# Patient Record
Sex: Female | Born: 1990 | Race: Black or African American | Hispanic: No | Marital: Single | Smoking: Never smoker
Health system: Southern US, Community
[De-identification: ages and names within clinical notes are randomized; demographics above are authoritative.]

## PROBLEM LIST (undated history)

## (undated) DIAGNOSIS — E669 Obesity, unspecified: Secondary | ICD-10-CM

## (undated) DIAGNOSIS — D271 Benign neoplasm of left ovary: Secondary | ICD-10-CM

## (undated) DIAGNOSIS — B001 Herpesviral vesicular dermatitis: Secondary | ICD-10-CM

## (undated) DIAGNOSIS — Z6841 Body Mass Index (BMI) 40.0 and over, adult: Secondary | ICD-10-CM

## (undated) HISTORY — PX: NO PAST SURGERIES: SHX2092

---
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
## 2015-06-01 DIAGNOSIS — B009 Herpesviral infection, unspecified: Secondary | ICD-10-CM | POA: Insufficient documentation

## 2015-11-20 ENCOUNTER — Inpatient Hospital Stay
Admission: EM | Admit: 2015-11-20 | Discharge: 2015-11-20 | Disposition: A | Discharge status: Home / Self Care | Payer: Medicaid Other | Source: Home / Self Care | Admitting: Obstetrics and Gynecology

## 2015-11-20 DIAGNOSIS — Z3A41 41 weeks gestation of pregnancy: Secondary | ICD-10-CM | POA: Insufficient documentation

## 2015-11-20 NOTE — Discharge Summary (Signed)
Physician Discharge Summary  Patient ID: KARAGAN SIRKIN MRN: [id number] DOB/AGE: Mar 22, 1991 25 y.o.  Admit date: 11/20/2015 Discharge date: 11/20/2015  Admission Diagnoses: Pt is G1P0 at [redacted]w[redacted]d with c/o loss of mucous plug and contractions every 30 minutes. She admits fetal movement. She denies LOF, VB. Pt has [facility] in [place]. She is here visiting family and scheduled for IOL on Wednesday of this week in [place].   Discharge Diagnoses:  Active Problems:   * No active hospital problems. * No cervical change, Reactive NST  Discharged Condition: good  Hospital Course: Pt is admitted for observation and placed on monitors. Cervical exam on admission and prior to discharge.  Consults: None  Significant Diagnostic Studies: none  Treatments: none  Discharge Exam: Temperature 98.2 F (36.8 C), temperature source Oral, height 5\' 5"  (1.651 m), weight 114.3 kg (252 lb). General appearance: alert, cooperative, appears stated age and no distress Resp: clear to auscultation bilaterally Cardio: regular rate and rhythm Pelvic: Cervix: 1/80/-3, no change from admission to discharge, pink mucous Toco: every 20-30 minutes Fetal Well Being: 135 bpm, moderate variability, +accelerations, -decelerations Category I tracing  Disposition: Discharge to home  Discharge Instructions    Discharge activity:  No Restrictions    Complete by:  As directed   Discharge diet:  No restrictions    Complete by:  As directed   Fetal Kick Count:  Lie on our left side for one hour after a meal, and count the number of times your baby kicks.  If it is less than 5 times, get up, move around and drink some juice.  Repeat the test 30 minutes later.  If it is still less than 5 kicks in an hour, notify your doctor.    Complete by:  As directed   LABOR:  When conractions begin, you should start to time them from the beginning of one contraction to the beginning  of the next.  When contractions are 5 - 10 minutes apart or less  and have been regular for at least an hour, you should call your health care provider.    Complete by:  As directed   No sexual activity restrictions    Complete by:  As directed   Notify physician for bleeding from the vagina    Complete by:  As directed   Notify physician for blurring of vision or spots before the eyes    Complete by:  As directed   Notify physician for chills or fever    Complete by:  As directed   Notify physician for fainting spells, "black outs" or loss of consciousness    Complete by:  As directed   Notify physician for increase in vaginal discharge    Complete by:  As directed   Notify physician for leaking of fluid    Complete by:  As directed   Notify physician for pain or burning when urinating    Complete by:  As directed   Notify physician for pelvic pressure (sudden increase)    Complete by:  As directed   Notify physician for severe or continued nausea or vomiting    Complete by:  As directed   Notify physician for sudden gushing of fluid from the vagina (with or without continued leaking)    Complete by:  As directed   Notify physician for sudden, constant, or occasional abdominal pain    Complete by:  As directed   Notify physician if baby moving less than usual    Complete  by:  As directed       Medication List    You have not been prescribed any medications.      SignedRod Can, CNM

## 2015-11-20 NOTE — Discharge Instructions (Signed)
Keep next scheduled appt with your provided Drucie Opitz and [facility] in [place] for November 22, 2015 for induction of labor unless seen prior to that for active labor.  Follow instructions given for labor precautions and signs of labor.  Lenore Cordia RNC

## 2015-11-20 NOTE — Plan of Care (Signed)
Pt ready for discharge home per CNM [place]. Pt agrees with plan of care. She has appt in [facility] August 2 for induction of labor with her provider Drucie Opitz and [employer].  Pt states she will be returning to [facility] asap.  Pt leaving dept in stable condition with family members ambulatory. Given verbal and written discharge instructions for labor precautions. Lenore Cordia RNC

## 2015-11-20 NOTE — OB Triage Note (Signed)
Pt arrived to [facility] with complaint of being overdue for pregnancy and loosing her mucous plug. Pt is a G1 P0 EDC November 11 2015 41.1 weeks. Pt is a patient of [facility] in [place]. She has received her care there and is visiting [place]. She has signed for records to be faxed here. Pt denies bleeding, leaking fluid and states active fetal  Movement. Denies allergies or any problems with pregnancy. Lenore Cordia RNC

## 2015-11-21 ENCOUNTER — Inpatient Hospital Stay: Payer: Medicaid Other | Admitting: Anesthesiology

## 2015-11-21 ENCOUNTER — Encounter
Admission: EM | Disposition: A | Discharge status: Home / Self Care | Payer: Self-pay | Source: Home / Self Care | Attending: Obstetrics and Gynecology

## 2015-11-21 ENCOUNTER — Inpatient Hospital Stay
Admission: EM | Admit: 2015-11-21 | Discharge: 2015-11-24 | DRG: 765 | Disposition: A | Discharge status: Home / Self Care | Payer: Medicaid Other | Attending: Obstetrics and Gynecology | Admitting: Obstetrics and Gynecology

## 2015-11-21 DIAGNOSIS — D62 Acute posthemorrhagic anemia: Secondary | ICD-10-CM | POA: Diagnosis not present

## 2015-11-21 DIAGNOSIS — Z6841 Body Mass Index (BMI) 40.0 and over, adult: Secondary | ICD-10-CM

## 2015-11-21 DIAGNOSIS — Z79899 Other long term (current) drug therapy: Secondary | ICD-10-CM

## 2015-11-21 DIAGNOSIS — Z833 Family history of diabetes mellitus: Secondary | ICD-10-CM

## 2015-11-21 DIAGNOSIS — O0993 Supervision of high risk pregnancy, unspecified, third trimester: Secondary | ICD-10-CM

## 2015-11-21 DIAGNOSIS — O480 Post-term pregnancy: Secondary | ICD-10-CM | POA: Diagnosis present

## 2015-11-21 DIAGNOSIS — O99214 Obesity complicating childbirth: Secondary | ICD-10-CM | POA: Diagnosis present

## 2015-11-21 DIAGNOSIS — O324XX0 Maternal care for high head at term, not applicable or unspecified: Secondary | ICD-10-CM | POA: Diagnosis not present

## 2015-11-21 DIAGNOSIS — Z3A41 41 weeks gestation of pregnancy: Secondary | ICD-10-CM | POA: Diagnosis not present

## 2015-11-21 DIAGNOSIS — Z87898 Personal history of other specified conditions: Secondary | ICD-10-CM

## 2015-11-21 DIAGNOSIS — O9081 Anemia of the puerperium: Secondary | ICD-10-CM | POA: Diagnosis not present

## 2015-11-21 DIAGNOSIS — E669 Obesity, unspecified: Secondary | ICD-10-CM | POA: Diagnosis present

## 2015-11-21 HISTORY — DX: Obesity, unspecified: E66.9

## 2015-11-21 HISTORY — DX: Body Mass Index (BMI) 40.0 and over, adult: Z684

## 2015-11-21 HISTORY — DX: Benign neoplasm of left ovary: D27.1

## 2015-11-21 HISTORY — DX: Herpesviral vesicular dermatitis: B00.1

## 2015-11-21 LAB — COMPREHENSIVE METABOLIC PANEL
ALT: 16 U/L (ref 14–54)
ANION GAP: 10 (ref 5–15)
AST: 25 U/L (ref 15–41)
Albumin: 3.4 g/dL — ABNORMAL LOW (ref 3.5–5.0)
Alkaline Phosphatase: 173 U/L — ABNORMAL HIGH (ref 38–126)
BILIRUBIN TOTAL: 0.7 mg/dL (ref 0.3–1.2)
BUN: 9 mg/dL (ref 6–20)
CO2: 20 mmol/L — ABNORMAL LOW (ref 22–32)
Calcium: 9.8 mg/dL (ref 8.9–10.3)
Chloride: 104 mmol/L (ref 101–111)
Creatinine, Ser: 0.58 mg/dL (ref 0.44–1.00)
GFR calc Af Amer: >60 mL/min (ref >60)
Glucose, Bld: 82 mg/dL (ref 65–99)
POTASSIUM: 4.1 mmol/L (ref 3.5–5.1)
Sodium: 134 mmol/L — ABNORMAL LOW (ref 135–145)
TOTAL PROTEIN: 7.4 g/dL (ref 6.5–8.1)

## 2015-11-21 LAB — URINE DRUG SCREEN, QUALITATIVE (ARMC ONLY)
Amphetamines, Ur Screen: NOT DETECTED
BARBITURATES, UR SCREEN: NOT DETECTED
Benzodiazepine, Ur Scrn: NOT DETECTED
CANNABINOID 50 NG, UR ~~LOC~~: NOT DETECTED
COCAINE METABOLITE, UR ~~LOC~~: NOT DETECTED
MDMA (Ecstasy)Ur Screen: NOT DETECTED
Methadone Scn, Ur: NOT DETECTED
OPIATE, UR SCREEN: NOT DETECTED
Phencyclidine (PCP) Ur S: NOT DETECTED
Tricyclic, Ur Screen: NOT DETECTED

## 2015-11-21 LAB — CBC
HCT: 36.2 % (ref 35.0–47.0)
Hemoglobin: 12.7 g/dL (ref 12.0–16.0)
MCH: 32.3 pg (ref 26.0–34.0)
MCHC: 35.2 g/dL (ref 32.0–36.0)
MCV: 91.6 fL (ref 80.0–100.0)
PLATELETS: 190 10*3/uL (ref 150–440)
RBC: 3.95 MIL/uL (ref 3.80–5.20)
RDW: 15.8 % — ABNORMAL HIGH (ref 11.5–14.5)
WBC: 17.4 10*3/uL — AB (ref 3.6–11.0)

## 2015-11-21 LAB — PROTEIN / CREATININE RATIO, URINE
CREATININE, URINE: 82 mg/dL
Protein Creatinine Ratio: 1.09 mg/mg{Cre} — ABNORMAL HIGH (ref 0.00–0.15)
Total Protein, Urine: 89 mg/dL

## 2015-11-21 SURGERY — Surgical Case
Anesthesia: Choice | Wound class: Clean Contaminated

## 2015-11-21 MED ORDER — KETOROLAC TROMETHAMINE 30 MG/ML IJ SOLN: 30.0000 mg | Freq: Four times a day (QID) | INTRAMUSCULAR | Status: DC | PRN

## 2015-11-21 MED ORDER — TERBUTALINE SULFATE 1 MG/ML IJ SOLN
0.2500 mg | Freq: Once | INTRAMUSCULAR | Status: AC
  Administered 2015-11-21: 0.25 mg via SUBCUTANEOUS

## 2015-11-21 MED ORDER — LACTATED RINGERS IV SOLN: 500.0000 mL | INTRAVENOUS | Status: DC | PRN

## 2015-11-21 MED ORDER — ONDANSETRON HCL 4 MG/2ML IJ SOLN: 4.0000 mg | Freq: Three times a day (TID) | INTRAMUSCULAR | Status: DC | PRN

## 2015-11-21 MED ORDER — NALOXONE HCL 2 MG/2ML IJ SOSY
1.0000 ug/kg/h | PREFILLED_SYRINGE | INTRAVENOUS | Status: DC | PRN
  Filled 2015-11-21: qty 2

## 2015-11-21 MED ORDER — OXYTOCIN 10 UNIT/ML IJ SOLN
INTRAMUSCULAR | Status: AC
  Filled 2015-11-21: qty 2

## 2015-11-21 MED ORDER — SODIUM CHLORIDE 0.9 % IV SOLN
INTRAVENOUS | Status: DC | PRN
  Administered 2015-11-21: 25 ug/min via INTRAVENOUS

## 2015-11-21 MED ORDER — AMMONIA AROMATIC IN INHA
RESPIRATORY_TRACT | Status: AC
  Filled 2015-11-21: qty 10

## 2015-11-21 MED ORDER — BUPIVACAINE 0.25 % ON-Q PUMP DUAL CATH 400 ML
400.0000 mL | INJECTION | Status: DC
  Filled 2015-11-21: qty 400

## 2015-11-21 MED ORDER — SOD CITRATE-CITRIC ACID 500-334 MG/5ML PO SOLN: 30.0000 mL | ORAL | Status: DC | PRN

## 2015-11-21 MED ORDER — LIDOCAINE HCL (PF) 1 % IJ SOLN
INTRAMUSCULAR | Status: AC
  Filled 2015-11-21: qty 30

## 2015-11-21 MED ORDER — EPHEDRINE SULFATE 50 MG/ML IJ SOLN
INTRAMUSCULAR | Status: DC | PRN
  Administered 2015-11-21: 4 mg via INTRAVENOUS

## 2015-11-21 MED ORDER — ONDANSETRON HCL 4 MG/2ML IJ SOLN: 4.0000 mg | Freq: Four times a day (QID) | INTRAMUSCULAR | Status: DC | PRN

## 2015-11-21 MED ORDER — NALOXONE HCL 0.4 MG/ML IJ SOLN: 0.4000 mg | INTRAMUSCULAR | Status: DC | PRN

## 2015-11-21 MED ORDER — BUPIVACAINE HCL (PF) 0.25 % IJ SOLN
INTRAMUSCULAR | Status: DC | PRN
  Administered 2015-11-21 (×2): 5 mL via EPIDURAL

## 2015-11-21 MED ORDER — OXYTOCIN 40 UNITS IN LACTATED RINGERS INFUSION - SIMPLE MED
2.5000 [IU]/h | INTRAVENOUS | Status: DC
  Administered 2015-11-21: 399 mL via INTRAVENOUS
  Administered 2015-11-21: 1 mL via INTRAVENOUS

## 2015-11-21 MED ORDER — OXYTOCIN 40 UNITS IN LACTATED RINGERS INFUSION - SIMPLE MED
INTRAVENOUS | Status: AC
  Filled 2015-11-21: qty 1000

## 2015-11-21 MED ORDER — DIPHENHYDRAMINE HCL 25 MG PO CAPS: 25.0000 mg | ORAL_CAPSULE | ORAL | Status: DC | PRN

## 2015-11-21 MED ORDER — NALBUPHINE HCL 10 MG/ML IJ SOLN: 5.0000 mg | Freq: Once | INTRAMUSCULAR | Status: DC | PRN

## 2015-11-21 MED ORDER — LACTATED RINGERS IV SOLN
INTRAVENOUS | Status: DC | PRN
  Administered 2015-11-21: 22:00:00 via INTRAVENOUS

## 2015-11-21 MED ORDER — CEFAZOLIN SODIUM-DEXTROSE 2-4 GM/100ML-% IV SOLN: 2.0000 g | INTRAVENOUS | Status: DC

## 2015-11-21 MED ORDER — MISOPROSTOL 200 MCG PO TABS
ORAL_TABLET | ORAL | Status: AC
  Filled 2015-11-21: qty 4

## 2015-11-21 MED ORDER — SODIUM CHLORIDE FLUSH 0.9 % IV SOLN
INTRAVENOUS | Status: AC
  Filled 2015-11-21: qty 30

## 2015-11-21 MED ORDER — LIDOCAINE 2% (20 MG/ML) 5 ML SYRINGE
INTRAMUSCULAR | Status: DC | PRN
  Administered 2015-11-21 (×2): 5 mg via INTRAVENOUS

## 2015-11-21 MED ORDER — OXYTOCIN BOLUS FROM INFUSION: 500.0000 mL | Freq: Once | INTRAVENOUS | Status: DC

## 2015-11-21 MED ORDER — OXYTOCIN 10 UNIT/ML IJ SOLN: 10.0000 [IU] | Freq: Once | INTRAMUSCULAR | Status: DC

## 2015-11-21 MED ORDER — TERBUTALINE SULFATE 1 MG/ML IJ SOLN
INTRAMUSCULAR | Status: AC
  Administered 2015-11-21: 0.25 mg
  Filled 2015-11-21: qty 1

## 2015-11-21 MED ORDER — LIDOCAINE HCL (PF) 1 % IJ SOLN: 30.0000 mL | INTRAMUSCULAR | Status: DC | PRN

## 2015-11-21 MED ORDER — CEFAZOLIN SODIUM-DEXTROSE 2-4 GM/100ML-% IV SOLN
INTRAVENOUS | Status: AC
  Administered 2015-11-21: 2 g
  Filled 2015-11-21: qty 100

## 2015-11-21 MED ORDER — LACTATED RINGERS IV SOLN
INTRAVENOUS | Status: DC
  Administered 2015-11-21: 16:00:00 via INTRAVENOUS

## 2015-11-21 MED ORDER — BUPIVACAINE HCL (PF) 0.5 % IJ SOLN
INTRAMUSCULAR | Status: AC
  Filled 2015-11-21: qty 30

## 2015-11-21 MED ORDER — FENTANYL 2.5 MCG/ML W/ROPIVACAINE 0.2% IN NS 100 ML EPIDURAL INFUSION (ARMC-ANES)
10.0000 mL/h | EPIDURAL | Status: DC
  Administered 2015-11-21: 250 ug via EPIDURAL
  Administered 2015-11-21: 10 mL/h via EPIDURAL

## 2015-11-21 MED ORDER — MEPERIDINE HCL 25 MG/ML IJ SOLN: 6.2500 mg | INTRAMUSCULAR | Status: DC | PRN

## 2015-11-21 MED ORDER — NALBUPHINE HCL 10 MG/ML IJ SOLN: 5.0000 mg | INTRAMUSCULAR | Status: DC | PRN

## 2015-11-21 MED ORDER — FENTANYL 2.5 MCG/ML W/ROPIVACAINE 0.2% IN NS 100 ML EPIDURAL INFUSION (ARMC-ANES)
EPIDURAL | Status: AC
  Administered 2015-11-21: 250 ug via EPIDURAL
  Filled 2015-11-21: qty 100

## 2015-11-21 MED ORDER — TERBUTALINE SULFATE 1 MG/ML IJ SOLN
INTRAMUSCULAR | Status: AC
  Administered 2015-11-21: 0.25 mg via SUBCUTANEOUS
  Filled 2015-11-21: qty 1

## 2015-11-21 MED ORDER — LIDOCAINE HCL (PF) 1 % IJ SOLN
INTRAMUSCULAR | Status: DC | PRN
  Administered 2015-11-21: 3 mL via SUBCUTANEOUS

## 2015-11-21 MED ORDER — DIPHENHYDRAMINE HCL 50 MG/ML IJ SOLN: 12.5000 mg | INTRAMUSCULAR | Status: DC | PRN

## 2015-11-21 MED ORDER — LIDOCAINE-EPINEPHRINE (PF) 1.5 %-1:200000 IJ SOLN
INTRAMUSCULAR | Status: DC | PRN
  Administered 2015-11-21: 3 mL via EPIDURAL

## 2015-11-21 MED ORDER — TERBUTALINE SULFATE 1 MG/ML IJ SOLN
INTRAMUSCULAR | Status: AC
  Filled 2015-11-21: qty 1

## 2015-11-21 MED ORDER — BUPIVACAINE HCL 0.5 % IJ SOLN
INTRAMUSCULAR | Status: DC | PRN
  Administered 2015-11-21: 10 mL

## 2015-11-21 MED ORDER — SODIUM CHLORIDE 0.9% FLUSH: 3.0000 mL | INTRAVENOUS | Status: DC | PRN

## 2015-11-21 MED ORDER — DEXTROSE 5 % IV SOLN
500.0000 mg | INTRAVENOUS | Status: AC
  Administered 2015-11-21: 500 mg via INTRAVENOUS
  Filled 2015-11-21: qty 500

## 2015-11-21 SURGICAL SUPPLY — 30 items
CANISTER SUCT 3000ML (MISCELLANEOUS) ×3 IMPLANT
CATH KIT ON-Q SILVERSOAK 5IN (CATHETERS) IMPLANT
CLOSURE WOUND 1/2 X4 (GAUZE/BANDAGES/DRESSINGS) ×1
DRSG OPSITE POSTOP 4X10 (GAUZE/BANDAGES/DRESSINGS) ×3 IMPLANT
DRSG OPSITE POSTOP 4X6 (GAUZE/BANDAGES/DRESSINGS) ×6 IMPLANT
DRSG TELFA 3X8 NADH (GAUZE/BANDAGES/DRESSINGS) ×3 IMPLANT
ELECT CAUTERY BLADE 6.4 (BLADE) ×3 IMPLANT
ELECT REM PT RETURN 9FT ADLT (ELECTROSURGICAL) ×3
ELECTRODE REM PT RTRN 9FT ADLT (ELECTROSURGICAL) ×1 IMPLANT
GAUZE SPONGE 4X4 12PLY STRL (GAUZE/BANDAGES/DRESSINGS) ×3 IMPLANT
GLOVE BIO SURGEON STRL SZ7 (GLOVE) ×3 IMPLANT
GLOVE INDICATOR 7.5 STRL GRN (GLOVE) ×3 IMPLANT
GOWN STRL REUS W/ TWL LRG LVL3 (GOWN DISPOSABLE) ×3 IMPLANT
GOWN STRL REUS W/TWL LRG LVL3 (GOWN DISPOSABLE) ×6
LIQUID BAND (GAUZE/BANDAGES/DRESSINGS) ×3 IMPLANT
NS IRRIG 1000ML POUR BTL (IV SOLUTION) ×3 IMPLANT
PACK C SECTION AR (MISCELLANEOUS) ×3 IMPLANT
PAD OB MATERNITY 4.3X12.25 (PERSONAL CARE ITEMS) ×6 IMPLANT
PAD PREP 24X41 OB/GYN DISP (PERSONAL CARE ITEMS) ×3 IMPLANT
SPONGE LAP 18X18 5 PK (GAUZE/BANDAGES/DRESSINGS) ×6 IMPLANT
STRIP CLOSURE SKIN 1/2X4 (GAUZE/BANDAGES/DRESSINGS) ×2 IMPLANT
SUT CHROMIC GUT BROWN 0 54 (SUTURE) IMPLANT
SUT CHROMIC GUT BROWN 0 54IN (SUTURE)
SUT MNCRL 4-0 (SUTURE) ×2
SUT MNCRL 4-0 27XMFL (SUTURE) ×1
SUT PDS AB 1 TP1 96 (SUTURE) ×3 IMPLANT
SUT PLAIN 2 0 XLH (SUTURE) ×6 IMPLANT
SUT VIC AB 0 CT1 36 (SUTURE) ×12 IMPLANT
SUTURE MNCRL 4-0 27XMF (SUTURE) ×1 IMPLANT
SWABSTK COMLB BENZOIN TINCTURE (MISCELLANEOUS) ×3 IMPLANT

## 2015-11-21 NOTE — Anesthesia Procedure Notes (Signed)
Epidural Patient location during procedure: OB Start time: 11/21/2015 5:26 PM End time: 11/21/2015 5:44 PM  Staffing Anesthesiologist: Martha Clan Performed: anesthesiologist   Preanesthetic Checklist Completed: patient identified, site marked, surgical consent, pre-op evaluation, timeout performed, IV checked, risks and benefits discussed and monitors and equipment checked  Epidural Patient position: sitting Prep: ChloraPrep Patient monitoring: heart rate, continuous pulse ox and blood pressure Approach: midline Location: L4-L5 Injection technique: LOR saline  Needle:  Needle type: Tuohy  Needle gauge: 17 G Needle length: 9 cm and 9 Needle insertion depth: 7 cm Catheter type: closed end flexible Catheter size: 19 Gauge Catheter at skin depth: 11 cm Test dose: negative and 1.5% lidocaine with Epi 1:200 K  Assessment Sensory level: T10 Events: blood not aspirated, injection not painful, no injection resistance, negative IV test and no paresthesia  Additional Notes Pt. Evaluated and documentation done after procedure finished. Patient identified. Risks/Benefits/Options discussed with patient including but not limited to bleeding, infection, nerve damage, paralysis, failed block, incomplete pain control, headache, blood pressure changes, nausea, vomiting, reactions to medication both or allergic, itching and postpartum back pain. Confirmed with bedside nurse the patient's most recent platelet count. Confirmed with patient that they are not currently taking any anticoagulation, have any bleeding history or any family history of bleeding disorders. Patient expressed understanding and wished to proceed. All questions were answered. Sterile technique was used throughout the entire procedure. Please see nursing notes for vital signs. Test dose was given through epidural catheter and negative prior to continuing to dose epidural or start infusion. Warning signs of high block given to the  patient including shortness of breath, tingling/numbness in hands, complete motor block, or any concerning symptoms with instructions to call for help. Patient was given instructions on fall risk and not to get out of bed. All questions and concerns addressed with instructions to call with any issues or inadequate analgesia.   Patient tolerated the insertion well without complications.  Reason for block:procedure for pain

## 2015-11-21 NOTE — Progress Notes (Signed)
Labor Check  Subj:  Complaints: comfortable with epidural   Obj:  BP 126/90 (BP Location: Left Arm)   Pulse (!) 109   Temp 98.3 F (36.8 C) (Oral)   Resp 20   Ht 5\' 5"  (1.651 m)   Wt 114.3 kg (252 lb)   BMI 41.93 kg/m     Cervix: Dilation: Lip/rim / Effacement (%): 100 / Station: 0  Baseline FHR: 160    Variability: moderate    Accelerations: absent    Decelerations: present. Patient with repetitive late decelerations, as low as the 90s. They have been present intermittently for the past several hours. RN report is that decelerations resolve with position changes and IV fluid.  No resolution of current run of late decelerations with intrauterine resuscitative measures. Contractions: present frequency: 5 q 10 min  A/P: 25 y.o. G1P0 female at [redacted]w[redacted]d with labor, category 2 tracing, remote from delivery.  1.  Labor: terbutaline at this time.  Will allow labor to resume once terbutaline wears off. Discussed my concerns with pateint and possible need for cesarean delivery if tracing continues, especially with no fetal descent. 2.  FWB: not-reassuring given fetal tracing. Plan for terbutatline to resolve decelerations. Overall assessment: category 2  3.  GBS negative with no risk factors (though greater than 5 weeks out from testing)  4.  Pain: well controlled with epidural 5.  Recheck: prn   Prentice Docker, MD 11/21/2015 8:48 PM

## 2015-11-21 NOTE — Progress Notes (Signed)
Repetitive late decelerations down to 60s. Patient complete and attempted pushing with no decent. Fetal heart rate drop with pushing.  Attempted vacuum delivery with three contractions with no demonstrable fetal descent.  With recurrent late decelerations will move to OR STAT for abdominal delivery.  Prentice Docker, MD 11/21/2015 10:04 PM

## 2015-11-21 NOTE — OR Nursing (Signed)
Emergency C Section. Verbal consent obtained by L&D NURSE. Dr. Rosey Bath was witness to the verbal consent as well as myself.

## 2015-11-21 NOTE — Discharge Summary (Signed)
OB Discharge Summary     Patient Name: Bailey Watson DOB: 12-17-1990 MRN: [id number]  Date of admission: 11/21/2015 Delivering MD: Will Bonnet, MD  Date of Delivery: 11/21/2015  Date of discharge: 11/24/15  Admitting diagnosis: 60 Weeks contraction D Cells Intrauterine pregnancy: [redacted]w[redacted]d     Secondary diagnosis: None     Discharge diagnosis: Term Pregnancy Delivered                                                                                                Post partum procedures:none  Augmentation: None  Complications: None  Hospital course:  Onset of Labor With Unplanned C/S  25 y.o. yo G1P0 at [redacted]w[redacted]d was admitted in [place] on 11/21/2015. Patient had a labor course significant for rupture of membranes when admitted for observation during a cervical check. She progressed naturally in labor.  Just before her cervix was completely dilated the fetal heart rate became concerning for repetitive late decelerations, initially responsive to intrauterine resuscitative measures. However, later her late decelerations become unresponsive to resuscitative measures. She received terbutaline which slowed her contractions a bit. However, she continued to have late decelerations. She became complete and +2 station. An attempt to push was made and the heart rate dropped to the 60s with slow return to baseline.  A vacuum was attempted, following all safety precautions. However, there was no fetal descent with gentle traction. Due to the depth of her late decelerations and remote from delivery, she was taken to the OR emergently for cesarean section. Membrane Rupture Time/Date: 3:42 PM ,11/21/2015   The patient went for cesarean section due to Non-Reassuring FHR, and delivered a Viable infant,11/21/2015  Details of operation can be found in separate operative note. Patient had an uncomplicated postpartum course.  She is ambulating,tolerating a regular diet, passing flatus, and urinating well.  Patient is  discharged home in stable condition 11/21/15.   Physical exam  Vitals:   11/23/15 2328 11/24/15 0354 11/24/15 0758 11/24/15 1153  BP:   127/74   Pulse:   98   Resp:   20   Temp: 98.4 F (36.9 C) 98.6 F (37 C) 97.9 F (36.6 C) 98.1 F (36.7 C)  TempSrc: Oral Oral Oral Oral  SpO2:   99%   Weight:      Height:       General: alert, cooperative and no distress Lochia: appropriate Uterine Fundus: firm Incision: Healing well with no significant drainage, Dressing is clean, dry, and intact DVT Evaluation: No evidence of DVT seen on physical exam.  Labs: Lab Results  Component Value Date   WBC 19.8 (H) 11/22/2015   HGB 11.2 (L) 11/22/2015   HCT 32.4 (L) 11/22/2015   MCV 93.2 11/22/2015   PLT 148 (L) 11/22/2015   CMP Latest Ref Rng & Units 11/21/2015  Glucose 65 - 99 mg/dL 82  BUN 6 - 20 mg/dL 9  Creatinine 0.44 - 1.00 mg/dL 0.58  Sodium 135 - 145 mmol/L 134(L)  Potassium 3.5 - 5.1 mmol/L 4.1  Chloride 101 - 111 mmol/L 104  CO2 22 - 32 mmol/L 20(L)  Calcium 8.9 - 10.3 mg/dL 9.8  Total Protein 6.5 - 8.1 g/dL 7.4  Total Bilirubin 0.3 - 1.2 mg/dL 0.7  Alkaline Phos 38 - 126 U/L 173(H)  AST 15 - 41 U/L 25  ALT 14 - 54 U/L 16    Discharge instruction:  Discharge instructions:   Call office if you have any of the following: headache, visual changes, fever >100 F, chills, breast concerns, excessive vaginal bleeding, incision drainage or problems, leg pain or redness, depression or any other concerns.   Activity: Do not lift > 10 lbs for 6 weeks.  No intercourse or tampons for 6 weeks.  No driving for 1-2 weeks.   Medications:    Medication List    STOP taking these medications   valACYclovir 500 MG tablet Commonly known as:  VALTREX     TAKE these medications   ibuprofen 600 MG tablet Commonly known as:  ADVIL,MOTRIN Take 1 tablet (600 mg total) by mouth every 6 (six) hours.   multivitamin-prenatal 27-0.8 MG Tabs tablet Take 1 tablet by mouth daily at 12  noon.   oxyCODONE-acetaminophen 5-325 MG tablet Commonly known as:  PERCOCET/ROXICET Take 1-2 tablets by mouth every 4 (four) hours as needed for moderate pain (pain score 4-7/10).       Diet: routine diet  Activity: Advance as tolerated. Pelvic rest for 6 weeks.   Outpatient follow up:   Follow-up Information    Will Bonnet, MD Follow up in 1 week(s).   Specialty:  Obstetrics and Gynecology Why:  incision check Contact information: [address] [place] [address] [phone]          Postpartum contraception: Nexplanon Rhogam Given postpartum: no Rubella vaccine given postpartum: no Varicella vaccine given postpartum: no TDaP given antepartum or postpartum: AP  Newborn Data: Live born female  Birth Weight: 8 lb 4.6 oz (3760 g) APGAR: 7, 9   Baby Feeding: Bottle and Breast  Disposition:home with mother  SIGNED: Updated 11/24/15 1:29 PM Burlene Arnt, CNM

## 2015-11-21 NOTE — Transfer of Care (Signed)
Immediate Anesthesia Transfer of Care Note  Patient: Bailey Watson  Procedure(s) Performed: Procedure(s): CESAREAN SECTION (N/A)  Patient Location: PACU  Anesthesia Type:Epidural  Level of Consciousness: awake, alert , oriented and patient cooperative  Airway & Oxygen Therapy: Patient Spontanous Breathing  Post-op Assessment: Report given to RN and Post -op Vital signs reviewed and stable  Post vital signs: Reviewed and stable  Last Vitals:  Vitals:   11/21/15 1505 11/21/15 1937  BP: 126/90   Pulse: (!) 109   Resp: 20   Temp: 36.9 C 36.8 C    Last Pain:  Vitals:   11/21/15 1937  TempSrc: Oral  PainSc:       Patients Stated Pain Goal: 3 ([id number])  Complications: No apparent anesthesia complications

## 2015-11-21 NOTE — H&P (Signed)
OB History & Physical   History of Present Illness:  Chief Complaint: contractions  HPI:  Bailey Watson is a 25 y.o. G1P0 female at [redacted]w[redacted]d dated by LMP.  Her pregnancy has been complicated by late entry into prenatal care (14 weeks), obesity with initial BMI 33 with total weight gain of 52 pounds). .    She reports contractions since 2-3AM today.   She denies leakage of fluid until arrival to this hospital today where her membranes ruptured on cervical exam.   She denies vaginal bleeding.   She reports fetal movement.   Denies headache, visual disturbances, and RUQ pain.  Denies herpes genitalis prodromal symptoms.   Maternal Medical History:   Past Medical History:  Diagnosis Date  . BMI 40.0-44.9, adult ([place])   . Dermoid cyst of left ovary   . Herpes labialis   . Obesity     Past Surgical History:  Procedure Laterality Date  . NO PAST SURGERIES      Allergies: No Known Allergies  Prior to Admission medications   Medication Sig Start Date End Date Taking? Authorizing Provider  Prenatal Vit-Fe Fumarate-FA (MULTIVITAMIN-PRENATAL) 27-0.8 MG TABS tablet Take 1 tablet by mouth daily at 12 noon.   Yes Historical Provider, MD  valACYclovir (VALTREX) 500 MG tablet Take 500 mg by mouth 2 (two) times daily.   Yes Historical Provider, MD    OB History  Gravida Para Term Preterm AB Living  1            SAB TAB Ectopic Multiple Live Births               # Outcome Date GA Lbr Len/2nd Weight Sex Delivery Anes PTL Lv  1 Current               Prenatal care site: [facility] in [place], [place]  Social History: She  reports that she has never smoked. She has never used smokeless tobacco. She reports that she uses drugs, including Marijuana. She reports that she does not drink alcohol.  Family History: family history includes Diabetes in her paternal grandfather.   Review of Systems: Negative x 10 systems reviewed except as noted in the HPI.    Physical Exam:  Vital  Signs: BP 126/90 (BP Location: Left Arm)   Pulse (!) 109   Temp 98.5 F (36.9 C) (Oral)   Resp 20   Ht 5\' 5"  (1.651 m)   Wt 114.3 kg (252 lb)   BMI 41.93 kg/m  General: no acute distress.  HEENT: normocephalic, atraumatic Heart: regular rate & rhythm.  No murmurs/rubs/gallops Lungs: clear to auscultation bilaterally Abdomen: soft, gravid, non-tender;  EFW: 8 pounds Pelvic: (female chaperone present during pelvic exam)  External: Normal external female genitalia (no evidence of herpetic lesion. No lesions identified)  Cervix: Dilation: 8 / Effacement (%): 90 / Station: -1   ROM: + pooling; + meconium Extremities: non-tender, symmetric, no edema bilaterally.  DTRs: 2+  Neurologic: Alert & oriented x 3.    Pertinent Results:  Prenatal Labs: Blood type/Rh B negative  Antibody screen negative  Rubella Immune  Varicella Unknown    RPR NR  HBsAg negative  HIV negative  GC negative  Chlamydia negative  Genetic screening No records of testing  1 hour GTT 82  3 hour GTT n/a  GBS negative on 10/03/15   Baseline FHR: 145 beats/min   Variability: moderate   Accelerations: present   Decelerations: absent Contractions: present frequency: 3-4 q  10 min  Overall assessment: category 1  Assessment:  Bailey Watson is a 25 y.o. G1P0 female at [redacted]w[redacted]d with advanced labor.   Plan:  1. Admit to Labor & Delivery  2. CBC, T&S, Clrs, IVF 3. GBS negative > 5 weeks ago. No risk factors.  So, per CDC recommendations, no treatment. 4. Fetwal well-being: reassuring 5. No evidence of HSV outbreak.  6. Expectant management. 7. UDS for h/o MJ use 8. Urine P/C for initially elevated BPs   Will Bonnet, MD 11/21/2015 4:27 PM

## 2015-11-21 NOTE — Anesthesia Preprocedure Evaluation (Signed)
Anesthesia Evaluation  Patient identified by MRN, date of birth, ID band Patient awake    Reviewed: Allergy & Precautions, H&P , NPO status , Patient's Chart, lab work & pertinent test results, reviewed documented beta blocker date and time   Airway Mallampati: III  TM Distance: >3 FB Neck ROM: full    Dental no notable dental hx. (+) Teeth Intact   Pulmonary neg pulmonary ROS,    Pulmonary exam normal breath sounds clear to auscultation       Cardiovascular Exercise Tolerance: Good negative cardio ROS Normal cardiovascular exam Rhythm:regular Rate:Normal     Neuro/Psych negative neurological ROS  negative psych ROS   GI/Hepatic Neg liver ROS, GERD  ,  Endo/Other  Morbid obesity  Renal/GU negative Renal ROS  negative genitourinary   Musculoskeletal   Abdominal   Peds  Hematology negative hematology ROS (+)   Anesthesia Other Findings Past Medical History: No date: BMI 40.0-44.9, adult ([facility]) No date: 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 No date: Obesity   Reproductive/Obstetrics (+) Pregnancy                             Anesthesia Physical Anesthesia Plan  ASA: III  Anesthesia Plan: Epidural   Post-op Pain Management:    Induction:   Airway Management Planned:   Additional Equipment:   Intra-op Plan:   Post-operative Plan:   Informed Consent: I have reviewed the patients History and Physical, chart, labs and discussed the procedure including the risks, benefits and alternatives for the proposed anesthesia with the patient or authorized representative who has indicated his/her understanding and acceptance.   Dental Advisory Given  Plan Discussed with: Anesthesiologist, CRNA and Surgeon  Anesthesia Plan Comments:         Anesthesia Quick Evaluation

## 2015-11-21 NOTE — Op Note (Signed)
Cesarean Section Operative Note    SONNI VANHOOSIER   11/21/2015   Pre-operative Diagnosis:  1) intrauterine pregnancy at [redacted]w[redacted]d  2) recurrent late decelerations   Post-operative Diagnosis:  1) intrauterine pregnancy at [redacted]w[redacted]d  2) recurrent late decelerations  Procedure:  1) Primary low transverse cesarean section via pfannenstiel incision with double-layer uterine closure 2) vaginal laceration repair  Surgeon: Surgeon(s) and Role:    * Will Bonnet, MD - Primary   Assistants: Pat Patrick, RN  Anesthesia: epidural   Findings:  1) normal appearing gravid uterus, fallopian tubes, and ovaries 2) viable female infant with APGARs of 7 and 9 at 1 and 5 minutes respectively   Estimated Blood Loss: 750 mL  Total IV Fluids: 1,100 ml   Urine Output: 40 mL of clear urine at end of case  Specimens: None  Complications: no complications  Disposition: PACU - hemodynamically stable.   Maternal Condition: stable   Baby condition / location:  Couplet care / Skin to Skin  Procedure Details:  The patient was seen in the Holding Room. The risks, benefits, complications, treatment options, and expected outcomes were discussed with the patient. The patient concurred with the proposed plan, giving informed consent. identified as Fran Lowes and the procedure verified as C-Section Delivery. A Time Out was held and the above information confirmed.   After induction of anesthesia, the patient was draped and prepped in the usual sterile manner. A Pfannenstiel incision was made and carried down through the subcutaneous tissue to the fascia. Fascial incision was made and extended transversely. The fascia was separated from the underlying rectus tissue superiorly and inferiorly. The peritoneum was identified and entered. Peritoneal incision was extended longitudinally. The bladder flap was not  freed from the lower uterine segment. A low transverse uterine incision was made and the hysterotomy  was extended with cranial-caudal tension. Delivered from cephalic presentation was a 3,760 gram Living newborn infant(s) or Female with Apgar scores of 7 at one minute and 9 at five minutes. Cord ph was not sent the umbilical cord was clamped and cut cord blood was obtained for evaluation. The placenta was removed Intact and appeared normal. The uterine outline, tubes and ovaries appeared normal. The uterine incision was closed with running locked sutures of 0 Vicryl.  A second layer of the same suture was thrown in an imbricating fashion.  Hemostasis was assured.  The uterus was returned to the abdomen and the paracolic gutters were cleared of all clots and debris.  The rectus muscles were inspected and found to be hemostatic.  The On-Q catheter pumps were inserted in accordance with the manufacturer's recommendations.  The catheters were inserted approximately 4cm cephelad to the incision line, approximately 1cm apart, straddling the midline.  They were inserted to a depth of the 4th mark. They were positioned superficial to the rectus abdominus muscles and deep to the rectus fascia.    The fascia was then reapproximated with running sutures of 1-0 PDS, looped. The subcutaneous tissue was reapproximated using 2-0 plain gut such that no greater than 2cm of dead space remained. The subcuticular closure was performed using 4-0 monocryl. The skin closure was reinforced surgical skin glue.  The On-Q catheters were bolused with 5 mL of 0.5% marcaine plain for a total of 10 mL.  The catheters were affixed to the skin with surgical skin glue, steri-strips, and tegaderm.    After the abdominal portion of the case was completed the vagina was inspected and there  were several lacerations that were bleeding. Using a 3-0 Vicryl the vaginal lacerations were repaired in the standard fashion so that the vaginal tissue was hemostatic.  Instrument, sponge, and needle counts were correct prior the abdominal closure and  were correct at the conclusion of the case.  The patient received Ancef 2 gram IV and Azithromycin 500 mg IV prior to skin incision (within 30 minutes). For VTE prophylaxis she was wearing SCDs throughout the case.  Signed: Will Bonnet, MD 11/21/2015 11:40 PM

## 2015-11-22 LAB — URINALYSIS COMPLETE WITH MICROSCOPIC (ARMC ONLY)
BACTERIA UA: NONE SEEN
BILIRUBIN URINE: NEGATIVE
Glucose, UA: NEGATIVE mg/dL
Ketones, ur: NEGATIVE mg/dL
LEUKOCYTES UA: NEGATIVE
Nitrite: NEGATIVE
PH: 6 (ref 5.0–8.0)
Protein, ur: 30 mg/dL — AB
Specific Gravity, Urine: 1.017 (ref 1.005–1.030)

## 2015-11-22 LAB — CBC
HEMATOCRIT: 32.4 % — AB (ref 35.0–47.0)
HEMOGLOBIN: 11.2 g/dL — AB (ref 12.0–16.0)
MCH: 32.2 pg (ref 26.0–34.0)
MCHC: 34.5 g/dL (ref 32.0–36.0)
MCV: 93.2 fL (ref 80.0–100.0)
Platelets: 148 10*3/uL — ABNORMAL LOW (ref 150–440)
RBC: 3.48 MIL/uL — ABNORMAL LOW (ref 3.80–5.20)
RDW: 15.9 % — ABNORMAL HIGH (ref 11.5–14.5)
WBC: 19.8 10*3/uL — ABNORMAL HIGH (ref 3.6–11.0)

## 2015-11-22 LAB — TYPE AND SCREEN
ABO/RH(D): B NEG
ANTIBODY SCREEN: NEGATIVE

## 2015-11-22 LAB — RPR: RPR Ser Ql: REACTIVE — AB

## 2015-11-22 LAB — RPR, QUANT+TP ABS (REFLEX)
Rapid Plasma Reagin, Quant: 1:1 {titer} — ABNORMAL HIGH
TREPONEMA PALLIDUM AB: NEGATIVE

## 2015-11-22 MED ORDER — NALBUPHINE HCL 10 MG/ML IJ SOLN: 5.0000 mg | INTRAMUSCULAR | Status: DC | PRN

## 2015-11-22 MED ORDER — FENTANYL CITRATE (PF) 100 MCG/2ML IJ SOLN
INTRAMUSCULAR | Status: AC
  Filled 2015-11-22: qty 2

## 2015-11-22 MED ORDER — DIPHENHYDRAMINE HCL 25 MG PO CAPS: 25.0000 mg | ORAL_CAPSULE | Freq: Four times a day (QID) | ORAL | Status: DC | PRN

## 2015-11-22 MED ORDER — IBUPROFEN 600 MG PO TABS
600.0000 mg | ORAL_TABLET | Freq: Four times a day (QID) | ORAL | Status: DC
  Administered 2015-11-23 – 2015-11-24 (×7): 600 mg via ORAL
  Filled 2015-11-22 (×7): qty 1

## 2015-11-22 MED ORDER — ONDANSETRON HCL 4 MG/2ML IJ SOLN: 4.0000 mg | Freq: Three times a day (TID) | INTRAMUSCULAR | Status: DC | PRN

## 2015-11-22 MED ORDER — DIBUCAINE 1 % RE OINT: 1.0000 "application " | TOPICAL_OINTMENT | RECTAL | Status: DC | PRN

## 2015-11-22 MED ORDER — FENTANYL CITRATE (PF) 100 MCG/2ML IJ SOLN
25.0000 ug | INTRAMUSCULAR | Status: DC | PRN
  Administered 2015-11-22 (×4): 25 ug via INTRAVENOUS

## 2015-11-22 MED ORDER — COCONUT OIL OIL: 1.0000 "application " | TOPICAL_OIL | Status: DC | PRN

## 2015-11-22 MED ORDER — DIPHENHYDRAMINE HCL 50 MG/ML IJ SOLN: 12.5000 mg | INTRAMUSCULAR | Status: DC | PRN

## 2015-11-22 MED ORDER — OXYCODONE HCL 5 MG PO TABS
5.0000 mg | ORAL_TABLET | ORAL | Status: DC | PRN
  Administered 2015-11-22: 10 mg via ORAL
  Administered 2015-11-22: 5 mg via ORAL
  Administered 2015-11-22: 10 mg via ORAL
  Administered 2015-11-22: 5 mg via ORAL
  Filled 2015-11-22 (×3): qty 2
  Filled 2015-11-22: qty 1

## 2015-11-22 MED ORDER — PRENATAL MULTIVITAMIN CH
1.0000 | ORAL_TABLET | Freq: Every day | ORAL | Status: DC
  Administered 2015-11-23 – 2015-11-24 (×2): 1 via ORAL
  Filled 2015-11-22 (×2): qty 1

## 2015-11-22 MED ORDER — SODIUM CHLORIDE 0.9% FLUSH: 3.0000 mL | INTRAVENOUS | Status: DC | PRN

## 2015-11-22 MED ORDER — FERROUS SULFATE 325 (65 FE) MG PO TABS
325.0000 mg | ORAL_TABLET | Freq: Two times a day (BID) | ORAL | Status: DC
  Administered 2015-11-22 – 2015-11-24 (×4): 325 mg via ORAL
  Filled 2015-11-22 (×4): qty 1

## 2015-11-22 MED ORDER — SIMETHICONE 80 MG PO CHEW
80.0000 mg | CHEWABLE_TABLET | Freq: Three times a day (TID) | ORAL | Status: DC
  Administered 2015-11-23 – 2015-11-24 (×5): 80 mg via ORAL
  Filled 2015-11-22 (×4): qty 1

## 2015-11-22 MED ORDER — OXYTOCIN 40 UNITS IN LACTATED RINGERS INFUSION - SIMPLE MED: 2.5000 [IU]/h | INTRAVENOUS | Status: DC

## 2015-11-22 MED ORDER — MEPERIDINE HCL 25 MG/ML IJ SOLN: 6.2500 mg | INTRAMUSCULAR | Status: DC | PRN

## 2015-11-22 MED ORDER — WITCH HAZEL-GLYCERIN EX PADS: 1.0000 "application " | MEDICATED_PAD | CUTANEOUS | Status: DC | PRN

## 2015-11-22 MED ORDER — ONDANSETRON HCL 4 MG/2ML IJ SOLN: 4.0000 mg | Freq: Once | INTRAMUSCULAR | Status: DC | PRN

## 2015-11-22 MED ORDER — MENTHOL 3 MG MT LOZG
1.0000 | LOZENGE | OROMUCOSAL | Status: DC | PRN
  Filled 2015-11-22: qty 9

## 2015-11-22 MED ORDER — OXYCODONE-ACETAMINOPHEN 5-325 MG PO TABS
2.0000 | ORAL_TABLET | ORAL | Status: DC | PRN
  Administered 2015-11-23: 2 via ORAL
  Filled 2015-11-22: qty 2

## 2015-11-22 MED ORDER — BUPIVACAINE HCL (PF) 0.5 % IJ SOLN: 5.0000 mL | Freq: Once | INTRAMUSCULAR | Status: DC

## 2015-11-22 MED ORDER — OXYCODONE-ACETAMINOPHEN 5-325 MG PO TABS
1.0000 | ORAL_TABLET | ORAL | Status: DC | PRN
  Administered 2015-11-23 – 2015-11-24 (×6): 1 via ORAL
  Filled 2015-11-22 (×6): qty 1

## 2015-11-22 MED ORDER — BUPIVACAINE ON-Q PAIN PUMP (FOR ORDER SET NO CHG): INJECTION | Status: DC

## 2015-11-22 MED ORDER — SENNOSIDES-DOCUSATE SODIUM 8.6-50 MG PO TABS
2.0000 | ORAL_TABLET | ORAL | Status: DC
  Administered 2015-11-22 – 2015-11-23 (×2): 2 via ORAL
  Filled 2015-11-22 (×2): qty 2

## 2015-11-22 MED ORDER — NALOXONE HCL 2 MG/2ML IJ SOSY
1.0000 ug/kg/h | PREFILLED_SYRINGE | INTRAVENOUS | Status: DC | PRN
  Filled 2015-11-22: qty 2

## 2015-11-22 MED ORDER — NALBUPHINE HCL 10 MG/ML IJ SOLN: 5.0000 mg | Freq: Once | INTRAMUSCULAR | Status: DC | PRN

## 2015-11-22 MED ORDER — KETOROLAC TROMETHAMINE 30 MG/ML IJ SOLN
30.0000 mg | Freq: Four times a day (QID) | INTRAMUSCULAR | Status: DC | PRN
  Administered 2015-11-22 (×2): 30 mg via INTRAVENOUS
  Filled 2015-11-22 (×2): qty 1

## 2015-11-22 MED ORDER — KETOROLAC TROMETHAMINE 30 MG/ML IJ SOLN: 30.0000 mg | Freq: Four times a day (QID) | INTRAMUSCULAR | Status: DC | PRN

## 2015-11-22 MED ORDER — NALOXONE HCL 0.4 MG/ML IJ SOLN: 0.4000 mg | INTRAMUSCULAR | Status: DC | PRN

## 2015-11-22 MED ORDER — DIPHENHYDRAMINE HCL 25 MG PO CAPS: 25.0000 mg | ORAL_CAPSULE | ORAL | Status: DC | PRN

## 2015-11-22 MED ORDER — IBUPROFEN 600 MG PO TABS
600.0000 mg | ORAL_TABLET | Freq: Four times a day (QID) | ORAL | Status: DC
  Administered 2015-11-22 (×2): 600 mg via ORAL
  Filled 2015-11-22 (×2): qty 1

## 2015-11-22 MED ORDER — LACTATED RINGERS IV SOLN
INTRAVENOUS | Status: DC
  Administered 2015-11-22: 17:00:00 via INTRAVENOUS

## 2015-11-22 NOTE — Anesthesia Post-op Follow-up Note (Signed)
  Anesthesia Pain Follow-up Note  Patient: Bailey Watson  Day #: 1  Date of Follow-up: 11/22/2015 Time: 7:27 AM  Last Vitals:  Vitals:   11/22/15 0429 11/22/15 0535  BP: 132/79 124/62  Pulse: (!) 110 (!) 108  Resp: (!) 22 20  Temp: 36.7 C 36.9 C    Level of Consciousness: alert  Pain: mild   Side Effects:None  Catheter Site Exam:clean     Plan: D/C from anesthesia care  [name]

## 2015-11-22 NOTE — Progress Notes (Addendum)
12 hours postop Primary Cesarean section for FITL/ failure to descend Subjective:   No nausea. Tolerating liquids. Pain medication providing relief.   Objective:  Blood pressure 133/65, pulse (!) 109, temperature 98.3 F (36.8 C), temperature source Oral, resp. rate 18, height 5' 5"  (1.651 m), weight 114.3 kg (252 lb), SpO2 99 %. Had temp spike to 101 shortly after CS UO 1440+ 500 this Am. Amber colored urine draining from foley General: NAD, alert, awake, answering questions appropriately. Pulmonary: no increased work of breathing/ CTAB Heart: mild tachycardia, no murmur Abdomen: obese,bowel sounds active fundus firm at level of umbilicus-2FB, sl tender Incision: Small amt dried blood on honeycomb dressing, ON Q intact Lochia: appropriate Extremities: SCDs on  Results for orders placed or performed during the hospital encounter of 11/21/15 (from the past 72 hour(s))  CBC     Status: Abnormal   Collection Time: 11/21/15  3:53 PM  Result Value Ref Range   WBC 17.4 (H) 3.6 - 11.0 K/uL   RBC 3.95 3.80 - 5.20 MIL/uL   Hemoglobin 12.7 12.0 - 16.0 g/dL   HCT 36.2 35.0 - 47.0 %   MCV 91.6 80.0 - 100.0 fL   MCH 32.3 26.0 - 34.0 pg   MCHC 35.2 32.0 - 36.0 g/dL   RDW 15.8 (H) 11.5 - 14.5 %   Platelets 190 150 - 440 K/uL  Type and screen [place]     Status: None   Collection Time: 11/21/15  3:53 PM  Result Value Ref Range   ABO/RH(D) B NEG    Antibody Screen NEG    Sample Expiration 11/24/2015   Comprehensive metabolic panel     Status: Abnormal   Collection Time: 11/21/15  3:53 PM  Result Value Ref Range   Sodium 134 (L) 135 - 145 mmol/L   Potassium 4.1 3.5 - 5.1 mmol/L   Chloride 104 101 - 111 mmol/L   CO2 20 (L) 22 - 32 mmol/L   Glucose, Bld 82 65 - 99 mg/dL   BUN 9 6 - 20 mg/dL   Creatinine, Ser 0.58 0.44 - 1.00 mg/dL   Calcium 9.8 8.9 - 10.3 mg/dL   Total Protein 7.4 6.5 - 8.1 g/dL   Albumin 3.4 (L) 3.5 - 5.0 g/dL   AST 25 15 - 41 U/L   ALT 16 14  - 54 U/L   Alkaline Phosphatase 173 (H) 38 - 126 U/L   Total Bilirubin 0.7 0.3 - 1.2 mg/dL   GFR calc non Af Amer >60 >60 mL/min   GFR calc Af Amer >60 >60 mL/min    Comment: (NOTE) The eGFR has been calculated using the CKD EPI equation. This calculation has not been validated in all clinical situations. eGFR's persistently <60 mL/min signify possible Chronic Kidney Disease.    Anion gap 10 5 - 15  Protein / creatinine ratio, urine     Status: Abnormal   Collection Time: 11/21/15  6:45 PM  Result Value Ref Range   Creatinine, Urine 82 mg/dL   Total Protein, Urine 89 mg/dL    Comment: NO NORMAL RANGE ESTABLISHED FOR THIS TEST   Protein Creatinine Ratio 1.09 (H) 0.00 - 0.15 mg/mg[Cre]  Urine Drug Screen, Qualitative ([facility] only)     Status: None   Collection Time: 11/21/15  6:45 PM  Result Value Ref Range   Tricyclic, Ur Screen NONE DETECTED NONE DETECTED   Amphetamines, Ur Screen NONE DETECTED NONE DETECTED   MDMA (Ecstasy)Ur Screen NONE DETECTED NONE DETECTED  Cocaine Metabolite,Ur [place] NONE DETECTED NONE DETECTED   Opiate, Ur Screen NONE DETECTED NONE DETECTED   Phencyclidine (PCP) Ur S NONE DETECTED NONE DETECTED   Cannabinoid 50 Ng, Ur [place] NONE DETECTED NONE DETECTED   Barbiturates, Ur Screen NONE DETECTED NONE DETECTED   Benzodiazepine, Ur Scrn NONE DETECTED NONE DETECTED   Methadone Scn, Ur NONE DETECTED NONE DETECTED    Comment: (NOTE) 371  Tricyclics, urine               Cutoff 1000 ng/mL 200  Amphetamines, urine             Cutoff 1000 ng/mL 300  MDMA (Ecstasy), urine           Cutoff 500 ng/mL 400  Cocaine Metabolite, urine       Cutoff 300 ng/mL 500  Opiate, urine                   Cutoff 300 ng/mL 600  Phencyclidine (PCP), urine      Cutoff 25 ng/mL 700  Cannabinoid, urine              Cutoff 50 ng/mL 800  Barbiturates, urine             Cutoff 200 ng/mL 900  Benzodiazepine, urine           Cutoff 200 ng/mL 1000 Methadone, urine                Cutoff 300  ng/mL 1100 1200 The urine drug screen provides only a preliminary, unconfirmed 1300 analytical test result and should not be used for non-medical 1400 purposes. Clinical consideration and professional judgment should 1500 be applied to any positive drug screen result due to possible 1600 interfering substances. A more specific alternate chemical method 1700 must be used in order to obtain a confirmed analytical result.  1800 Gas chromato graphy / mass spectrometry (GC/MS) is the preferred 1900 confirmatory method.   CBC     Status: Abnormal   Collection Time: 11/22/15  5:43 AM  Result Value Ref Range   WBC 19.8 (H) 3.6 - 11.0 K/uL   RBC 3.48 (L) 3.80 - 5.20 MIL/uL   Hemoglobin 11.2 (L) 12.0 - 16.0 g/dL   HCT 32.4 (L) 35.0 - 47.0 %   MCV 93.2 80.0 - 100.0 fL   MCH 32.2 26.0 - 34.0 pg   MCHC 34.5 32.0 - 36.0 g/dL   RDW 15.9 (H) 11.5 - 14.5 %   Platelets 148 (L) 150 - 440 K/uL     Assessment:   25 y.o. G1P0 postoperativeday # 1-stable  Temp spike after delivery with 1090 mgm protein on PC ratio-R/O UTI   UA with C&S from foley   DC foley after lunch  Advance diet as tolerated  Ambulate after foley removed     Plan:  1)Mild  acute blood loss anemia - hemodynamically stable (mild tachycardia) - po ferrous sulfate/ vitamins  2) --/--/B NEG (08/01 1553) /   / Varicella unknown/ RI  Baby also B neg-Rhogam not indicated  3) TDAP UTD   4) Breast/Bottle  5) Disposition-probably on POD3 or4   Deneene Tarver, CNM

## 2015-11-22 NOTE — Lactation Note (Signed)
This note was copied from a baby's chart. Lactation Consultation Note  Patient Name: Bailey Watson [id number] Date: 11/22/2015 Reason for consult: Follow-up assessment   Maternal Data  Mom has been instructed on breastfeeding basics, especially getting help with latching on. Mom needs much help with that as baby tends to suck on her own lips and not open mouth wide. Mom states that she still plans to breast and bottle feed now and give pacifiers even though she knows that those devices may be impeding latch. Nipple shield started in light of these findings.   Feeding Feeding Type: Breast Fed  LATCH Score/Interventions Latch: Repeated attempts needed to sustain latch, nipple held in mouth throughout feeding, stimulation needed to elicit sucking reflex. (would not latch to her left breast; nipple shield helped) Intervention(s): Assist with latch (24 mm NS)  Audible Swallowing: A few with stimulation Intervention(s): Hand expression  Type of Nipple: Flat (left nipple a little flat) Intervention(s): Reverse pressure (NS)   Comfort (Breast/Nipple): Soft / non-tender     Hold (Positioning): Assistance needed to correctly position infant at breast and maintain latch. Intervention(s): Breastfeeding basics reviewed;Support Pillows  LATCH Score: 6  Lactation Tools Discussed/Used     Consult Status Consult Status: Follow-up Date: 11/23/15 Follow-up type: In-patient    Roque Cash 11/22/2015, 4:04 PM

## 2015-11-22 NOTE — Anesthesia Postprocedure Evaluation (Signed)
Anesthesia Post Note  Patient: Bailey Watson  Procedure(s) Performed: Procedure(s) (LRB): CESAREAN SECTION (N/A)  Patient location during evaluation: Mother Baby Anesthesia Type: Epidural Level of consciousness: awake and alert Pain management: satisfactory to patient Vital Signs Assessment: post-procedure vital signs reviewed and stable Respiratory status: spontaneous breathing Cardiovascular status: blood pressure returned to baseline Postop Assessment: no headache, no backache and epidural receding Anesthetic complications: no    Last Vitals:  Vitals:   11/22/15 0429 11/22/15 0535  BP: 132/79 124/62  Pulse: (!) 110 (!) 108  Resp: (!) 22 20  Temp: 36.7 C 36.9 C    Last Pain:  Vitals:   11/22/15 0535  TempSrc: Oral  PainSc:                  [name]

## 2015-11-23 LAB — URINE CULTURE: Culture: NO GROWTH

## 2015-11-23 NOTE — Progress Notes (Signed)
  Postpartum Day 2  Subjective: no complaints, voiding, tolerating PO and + flatus  Objective: Blood pressure 133/74, pulse (!) 107, temperature 98.2 F (36.8 C), temperature source Oral, resp. rate 18, height 5\' 5"  (1.651 m), weight 252 lb (114.3 kg), SpO2 98 %.  Physical Exam:  General: alert and cooperative Lochia: appropriate Uterine Fundus: firm Incision: healing well, no significant drainage DVT Evaluation: No evidence of DVT seen on physical exam. Abdomen: mildly distended   Recent Labs  11/21/15 1553 11/22/15 0543  HGB 12.7 11.2*  HCT 36.2 32.4*    Assessment POD#2  Plan: Continue PO care and Advance activity as tolerated  Feeding: Bottle Contraception: Nexplanon Blood Type: B neg (baby B neg also) RI/V unknown TDAP UTD   Burlene Arnt, [place] 11/23/2015, 11:48 AM

## 2015-11-24 MED ORDER — OXYCODONE-ACETAMINOPHEN 5-325 MG PO TABS: 1.0000 | ORAL_TABLET | ORAL | 0 refills | Status: DC | PRN

## 2015-11-24 MED ORDER — IBUPROFEN 600 MG PO TABS: 600.0000 mg | ORAL_TABLET | Freq: Four times a day (QID) | ORAL | 0 refills | Status: DC

## 2015-11-24 NOTE — Progress Notes (Signed)
Discharge instructions provided.  Pt and pt's mother verbalize understanding of all instructions and follow-up care. Prescriptions given.  Pt discharged to home with infant at 1530 on 11/24/15 via wheelchair by volunteer. Reed Breech, RN 11/24/2015 3:47 PM

## 2015-11-24 NOTE — Progress Notes (Signed)
Pt states that TDaP vaccine was given during pregnancy.  Pt declines vaccine at this time. Reed Breech, RN 11/24/2015 2:42 PM

## 2015-11-24 NOTE — Discharge Instructions (Signed)
Call your doctor for increased pain or vaginal bleeding, temperature above 100.4, depression, or concerns.  No strenuous activity or heavy lifting for 6 weeks.  No intercourse, tampons, douching, or enemas for 6 weeks.  No tub baths-showers only.  No driving for 2 weeks or while taking pain medications.  Continue prenatal vitamin and iron.  Keep incision clean and dry.  Call your doctor for incision concerns including redness, swelling, bleeding or drainage, or if begins to come apart.  Increase calories and fluids while breastfeeding. °

## 2015-11-27 ENCOUNTER — Encounter: Payer: Self-pay | Admitting: Obstetrics and Gynecology

## 2015-12-03 ENCOUNTER — Emergency Department
Admission: EM | Admit: 2015-12-03 | Discharge: 2015-12-03 | Disposition: A | Discharge status: Home / Self Care | Payer: PRIVATE HEALTH INSURANCE | Attending: Student | Admitting: Student

## 2015-12-03 ENCOUNTER — Encounter: Payer: Self-pay | Admitting: *Deleted

## 2015-12-03 DIAGNOSIS — O9089 Other complications of the puerperium, not elsewhere classified: Secondary | ICD-10-CM

## 2015-12-03 DIAGNOSIS — Z4801 Encounter for change or removal of surgical wound dressing: Secondary | ICD-10-CM | POA: Diagnosis present

## 2015-12-03 DIAGNOSIS — Z791 Long term (current) use of non-steroidal anti-inflammatories (NSAID): Secondary | ICD-10-CM | POA: Insufficient documentation

## 2015-12-03 DIAGNOSIS — Z9889 Other specified postprocedural states: Secondary | ICD-10-CM | POA: Insufficient documentation

## 2015-12-03 NOTE — ED Provider Notes (Signed)
[facility] Emergency Department Provider Note   ____________________________________________   First MD Initiated Contact with Patient 12/03/15 1641     (approximate)  I have reviewed the triage vital signs and the nursing notes.   HISTORY  Chief Complaint Wound Check    HPI Sahithi LOLLY PELOWSKI is a 25 y.o. female with no chronic medical problems, status post C-section on 11/21/2015 who presents for evaluation of an open area on her abdominal incision which she noticed yesterday, gradual onset, constant, mild, no modifying factors. She reports it is not painful, there has been a very small amount of yellow drainage. She denies any abdominal pain, fevers, chills, chest pain or difficulty breathing. She reports that she has had normal vaginal bleeding after delivery which is "slowing down", no foul-smelling lochia.   Past Medical History:  Diagnosis Date  . BMI 40.0-44.9, adult ([place])   . Dermoid cyst of left ovary   . Herpes labialis   . Obesity     Patient Active Problem List   Diagnosis Date Noted  . Supervision of high risk pregnancy in third trimester 11/21/2015  . BMI 40.0-44.9, adult ([place]) 11/21/2015  . [redacted] weeks gestation of pregnancy 11/21/2015  . Obesity     Past Surgical History:  Procedure Laterality Date  . CESAREAN SECTION N/A 11/21/2015   Procedure: CESAREAN SECTION;  Surgeon: Will Bonnet, MD;  Location: [facility] ORS;  Service: Obstetrics;  Laterality: N/A;  . NO PAST SURGERIES      Prior to Admission medications   Medication Sig Start Date End Date Taking? Authorizing Provider  ibuprofen (ADVIL,MOTRIN) 600 MG tablet Take 1 tablet (600 mg total) by mouth every 6 (six) hours. 11/24/15   Burlene Arnt, CNM  oxyCODONE-acetaminophen (PERCOCET/ROXICET) 5-325 MG tablet Take 1-2 tablets by mouth every 4 (four) hours as needed for moderate pain (pain score 4-7/10). 11/24/15   Burlene Arnt, CNM  Prenatal Vit-Fe Fumarate-FA (MULTIVITAMIN-PRENATAL)  27-0.8 MG TABS tablet Take 1 tablet by mouth daily at 12 noon.    Historical Provider, MD    Allergies Review of patient's allergies indicates no known allergies.  Family History  Problem Relation Age of Onset  . Diabetes Paternal Grandfather     Social History Social History  Substance Use Topics  . Smoking status: Never Smoker  . Smokeless tobacco: Never Used  . Alcohol use No    Review of Systems Constitutional: No fever/chills Eyes: No visual changes. ENT: No sore throat. Cardiovascular: Denies chest pain. Respiratory: Denies shortness of breath. Gastrointestinal: No abdominal pain.  No nausea, no vomiting.  No diarrhea.  No constipation. Genitourinary: Negative for dysuria. Musculoskeletal: Negative for back pain. Skin: Negative for rash. Neurological: Negative for headaches, focal weakness or numbness.  10-point ROS otherwise negative.  ____________________________________________   PHYSICAL EXAM:  VITAL SIGNS: ED Triage Vitals [12/03/15 1549]  Enc Vitals Group     BP 124/87     Pulse Rate 96     Resp 18     Temp 97.8 F (36.6 C)     Temp Source Oral     SpO2 99 %     Weight 220 lb (99.8 kg)     Height 5\' 5"  (1.651 m)     Head Circumference      Peak Flow      Pain Score      Pain Loc      Pain Edu?      Excl. in [place]?     Constitutional: Alert  and oriented. Well appearing and in no acute distress. Eyes: Conjunctivae are normal. PERRL. EOMI. Head: Atraumatic. Nose: No congestion/rhinnorhea. Mouth/Throat: Mucous membranes are moist.  Oropharynx non-erythematous. Neck: No stridor.  Supple without meningismus. Cardiovascular: Normal rate, regular rhythm. Grossly normal heart sounds.  Good peripheral circulation. Respiratory: Normal respiratory effort.  No retractions. Lungs CTAB. Gastrointestinal: Soft and nontender. Nontender uterine fundus palpated just above the level of the pubic symphysis. The C-section surgical incision is closed with  Dermabond, there is a very small area at the right side of the incision, measuring less than 0.5 cm that is slightly open superficially, this does not probe at all with a q-tip, there is a tiny amount of yellow drainage but no erythema, no fluctuance. Genitourinary: deferred Musculoskeletal: No lower extremity tenderness nor edema.  No joint effusions. Neurologic:  Normal speech and language. No gross focal neurologic deficits are appreciated. No gait instability. Skin:  Skin is warm, dry and intact. No rash noted. Psychiatric: Mood and affect are normal. Speech and behavior are normal.  ____________________________________________   LABS (all labs ordered are listed, but only abnormal results are displayed)  Labs Reviewed  AEROBIC CULTURE (SUPERFICIAL SPECIMEN)   ____________________________________________  EKG  none ____________________________________________  RADIOLOGY  none ____________________________________________   PROCEDURES  Procedure(s) performed: None  Procedures  Critical Care performed: No  ____________________________________________   INITIAL IMPRESSION / ASSESSMENT AND PLAN / ED COURSE  Pertinent labs & imaging results that were available during my care of the patient were reviewed by me and considered in my medical decision making (see chart for details).  BONETTA STIGERS is a 25 y.o. female with no chronic medical problems, status post C-section on 11/21/2015 who presents for evaluation of an open area on her abdominal incision which she noticed yesterday. Exam, she is generally very well-appearing and in no acute distress. Vital signs stable, she is afebrile. She has a benign abdominal exam.The C-section surgical incision is closed with Dermabond, there is a very small area at the right side of the incision, measuring less than 0.5 cm that is slightly open very superficially, this does not probe at all with a q-tip, there is a small amount of yellow  drainage but no erythema, no fluctuance. I have applied Steri-Strips to the wound. I have sent wound cultures. I discussed with the patient that she needs to follow-up with her OB/GYN Dr. Glennon Mac within the next 1-2 days for wound check. We discussed return precautions, need for close follow-up and she is comfortable with the discharge plan. DC home.  Clinical Course     ____________________________________________   FINAL CLINICAL IMPRESSION(S) / ED DIAGNOSES  Final diagnoses:  Complication of obstetrical surgical wound      NEW MEDICATIONS STARTED DURING THIS VISIT:  Discharge Medication List as of 12/03/2015  5:17 PM       Note:  This document was prepared using Dragon voice recognition software and may include unintentional dictation errors.    Joanne Gavel, MD 12/03/15 [phone]

## 2015-12-03 NOTE — ED Notes (Signed)
Patient states that she had c-section on 11/21/15, patient was seen by Dr. Glennon Mac for check up on Friday and he told patient that her incision looked ok. Patient states that last night she noticed a small amount of yellow drainage from the incision. Patient states that she has not had any N/V/D, fever, or chills.

## 2015-12-03 NOTE — ED Triage Notes (Signed)
Pt had C-section on 8/1, pt noticed open area on incision; yellow drainage noted per pt

## 2015-12-06 LAB — AEROBIC CULTURE W GRAM STAIN (SUPERFICIAL SPECIMEN)

## 2015-12-06 LAB — AEROBIC CULTURE  (SUPERFICIAL SPECIMEN)
GRAM STAIN: NONE SEEN
SPECIAL REQUESTS: NORMAL

## 2015-12-07 NOTE — Progress Notes (Signed)
25 yo female who presented to the Ed on 8/13 to check her incision from her c-section. Cultures taken on 8/13 from wound and positive for enterococcus species and klebsiella pneumoniae (ampicillin resistant). Notified OBGYN office of positive wound cx. Spoke to pt and called in a RX for Augmentin 875mg  q12h for 7 days to [facility] on [address] by Merlyn Lot, MD.   Loree Fee  3:46 PM 12/07/2015

## 2016-09-25 ENCOUNTER — Encounter (HOSPITAL_COMMUNITY): Payer: Self-pay

## 2017-09-26 ENCOUNTER — Ambulatory Visit (INDEPENDENT_AMBULATORY_CARE_PROVIDER_SITE_OTHER): Payer: BLUE CROSS/BLUE SHIELD | Admitting: Advanced Practice Midwife

## 2017-09-26 ENCOUNTER — Other Ambulatory Visit: Payer: Self-pay

## 2017-09-26 ENCOUNTER — Encounter: Payer: Self-pay | Admitting: Advanced Practice Midwife

## 2017-09-26 VITALS — BP 118/72 | HR 80 | Ht 65.0 in | Wt 172.0 lb

## 2017-09-26 DIAGNOSIS — Z113 Encounter for screening for infections with a predominantly sexual mode of transmission: Secondary | ICD-10-CM

## 2017-09-26 DIAGNOSIS — D271 Benign neoplasm of left ovary: Secondary | ICD-10-CM | POA: Diagnosis not present

## 2017-09-26 DIAGNOSIS — R1032 Left lower quadrant pain: Secondary | ICD-10-CM | POA: Diagnosis not present

## 2017-09-26 NOTE — Patient Instructions (Signed)
Ovarian Cyst An ovarian cyst is a fluid-filled sac that forms on an ovary. The ovaries are small organs that produce eggs in women. Various types of cysts can form on the ovaries. Some may cause symptoms and require treatment. Most ovarian cysts go away on their own, are not cancerous (are benign), and do not cause problems. Common types of ovarian cysts include:  Functional (follicle) cysts. ? Occur during the menstrual cycle, and usually go away with the next menstrual cycle if you do not get pregnant. ? Usually cause no symptoms.  Endometriomas. ? Are cysts that form from the tissue that lines the uterus (endometrium). ? Are sometimes called "chocolate cysts" because they become filled with blood that turns brown. ? Can cause pain in the lower abdomen during intercourse and during your period.  Cystadenoma cysts. ? Develop from cells on the outside surface of the ovary. ? Can get very large and cause lower abdomen pain and pain with intercourse. ? Can cause severe pain if they twist or break open (rupture).  Dermoid cysts. ? Are sometimes found in both ovaries. ? May contain different kinds of body tissue, such as skin, teeth, hair, or cartilage. ? Usually do not cause symptoms unless they get very big.  Theca lutein cysts. ? Occur when too much of a certain hormone (human chorionic gonadotropin) is produced and overstimulates the ovaries to produce an egg. ? Are most common after having procedures used to assist with the conception of a baby (in vitro fertilization).  What are the causes? Ovarian cysts may be caused by:  Ovarian hyperstimulation syndrome. This is a condition that can develop from taking fertility medicines. It causes multiple large ovarian cysts to form.  Polycystic ovarian syndrome (PCOS). This is a common hormonal disorder that can cause ovarian cysts, as well as problems with your period or fertility.  What increases the risk? The following factors may make  you more likely to develop ovarian cysts:  Being overweight or obese.  Taking fertility medicines.  Taking certain forms of hormonal birth control.  Smoking.  What are the signs or symptoms? Many ovarian cysts do not cause symptoms. If symptoms are present, they may include:  Pelvic pain or pressure.  Pain in the lower abdomen.  Pain during sex.  Abdominal swelling.  Abnormal menstrual periods.  Increasing pain with menstrual periods.  How is this diagnosed? These cysts are commonly found during a routine pelvic exam. You may have tests to find out more about the cyst, such as:  Ultrasound.  X-ray of the pelvis.  CT scan.  MRI.  Blood tests.  How is this treated? Many ovarian cysts go away on their own without treatment. Your health care provider may want to check your cyst regularly for 2-3 months to see if it changes. If you are in menopause, it is especially important to have your cyst monitored closely because menopausal women have a higher rate of ovarian cancer. When treatment is needed, it may include:  Medicines to help relieve pain.  A procedure to drain the cyst (aspiration).  Surgery to remove the whole cyst.  Hormone treatment or birth control pills. These methods are sometimes used to help dissolve a cyst.  Follow these instructions at home:  Take over-the-counter and prescription medicines only as told by your health care provider.  Do not drive or use heavy machinery while taking prescription pain medicine.  Get regular pelvic exams and Pap tests as often as told by your health care   provider.  Return to your normal activities as told by your health care provider. Ask your health care provider what activities are safe for you.  Do not use any products that contain nicotine or tobacco, such as cigarettes and e-cigarettes. If you need help quitting, ask your health care provider.  Keep all follow-up visits as told by your health care provider.  This is important. Contact a health care provider if:  Your periods are late, irregular, or painful, or they stop.  You have pelvic pain that does not go away.  You have pressure on your bladder or trouble emptying your bladder completely.  You have pain during sex.  You have any of the following in your abdomen: ? A feeling of fullness. ? Pressure. ? Discomfort. ? Pain that does not go away. ? Swelling.  You feel generally ill.  You become constipated.  You lose your appetite.  You develop severe acne.  You start to have more body hair and facial hair.  You are gaining weight or losing weight without changing your exercise and eating habits.  You think you may be pregnant. Get help right away if:  You have abdominal pain that is severe or gets worse.  You cannot eat or drink without vomiting.  You suddenly develop a fever.  Your menstrual period is much heavier than usual. This information is not intended to replace advice given to you by your health care provider. Make sure you discuss any questions you have with your health care provider. Document Released: 04/08/2005 Document Revised: 10/27/2015 Document Reviewed: 09/10/2015 Elsevier Interactive Patient Education  2018 [employer]  

## 2017-09-26 NOTE — Progress Notes (Signed)
Patient ID: Bailey Watson, female   DOB: 1990/12/21, 27 y.o.   MRN: [id number]  Reason for Visit: Gynecologic Exam (Pelvic pain x 1 wk. Denies UTI s&s, No D/C)     Subjective:     Gynecologic Exam  This is a new problem. Episode onset: 1 week ago. The problem occurs intermittently. The problem has been unchanged. The pain is moderate (aching pain). The problem affects the left side. She is not pregnant. The symptoms are aggravated by tactile pressure. She has tried nothing for the symptoms. She is sexually active with multiple partners. No, her partner does not have an STD. She uses condoms for contraception. Her menstrual history has been regular. Her past medical history is significant for a Cesarean section and ovarian cysts. (Left dermoid cyst noted 2 years ago)  She is bi-sexual and has a new partner. She requests STD testing today.   Bailey Watson is a 27 y.o. female who presents to the office with the above symptoms. She denies s/s of UTI. She denies vaginal discharge, itching or irritation. Her Nexplanon site is occasionally uncomfortable but it is bearable.   Past Medical History:  Diagnosis Date  . BMI 40.0-44.9, adult ([place])   . Dermoid cyst of left ovary   . Herpes labialis   . Obesity    Family History  Problem Relation Age of Onset  . Diabetes Paternal Grandfather    Past Surgical History:  Procedure Laterality Date  . CESAREAN SECTION N/A 11/21/2015   Procedure: CESAREAN SECTION;  Surgeon: Will Bonnet, MD;  Location: [facility] ORS;  Service: Obstetrics;  Laterality: N/A;         Short Social History:  Social History   Tobacco Use  . Smoking status: Never Smoker  . Smokeless tobacco: Never Used  Substance Use Topics  . Alcohol use: Yes    Comment: occasional    No Known Allergies  Current Outpatient Medications  Medication Sig Dispense Refill  . valACYclovir (VALTREX) 500 MG tablet Take by mouth.     No current facility-administered medications for this  visit.     Review of Systems  Constitutional:  Constitutional negative. HENT: HENT negative.  Respiratory:       Nasal congestion Cardiovascular: Cardiovascular negative.  GI: Gastrointestinal negative.  GU: Genitourinary negative. Musculoskeletal: Musculoskeletal negative.  Skin: Skin negative.  Neurological: Neurological negative. Hematologic: Hematologic/lymphatic negative.  Psychiatric: Psychiatric negative.        Objective:  Objective   Vitals:   09/26/17 1459  BP: 118/72  Pulse: 80  Weight: 172 lb (78 kg)  Height: 5\' 5"  (1.651 m)   Body mass index is 28.62 kg/m.  Vital Signs: BP 118/72 (BP Location: Left Arm, Patient Position: Sitting, Cuff Size: Normal)   Pulse 80   Ht 5\' 5"  (1.651 m)   Wt 172 lb (78 kg)   LMP 09/26/2017   BMI 28.62 kg/m  Constitutional: Well nourished, well developed female in no acute distress.  HEENT: normal Skin: Warm and dry.  Cardiovascular: Regular rate and rhythm.   Respiratory: Clear to auscultation bilateral. Normal respiratory effort Abdomen: mild tenderness to palpation in lower left Psych: Alert and Oriented x3. No memory deficits. Normal mood and affect.    Pelvic exam:  is not limited by body habitus EGBUS: within normal limits Vagina: within normal limits and with normal mucosa, blood in the vault Cervix: normal appearance Uterus: mobile, non-tender, not enlarged Adnexa: mildly tender on right, increased tenderness to palpation on left  with possible increase in size      Assessment/Plan:     27 yo female G1 P1001 with lower left pelvic pain, possible change in dermoid cyst, STD testing  NuSwab today STD blood work today RTC within 1 week for ultrasound imaging of pelvis to evaluate left dermoid cyst     [facility]

## 2017-09-27 LAB — HSV 2 ANTIBODY, IGG: HSV 2 IgG, Type Spec: <0.91 index (ref 0.00–0.90)

## 2017-09-27 LAB — HIV ANTIBODY (ROUTINE TESTING W REFLEX): HIV SCREEN 4TH GENERATION: NONREACTIVE

## 2017-09-27 LAB — RPR QUALITATIVE: RPR Ser Ql: NONREACTIVE

## 2017-09-27 LAB — HEPATITIS B SURFACE ANTIBODY,QUALITATIVE: Hep B Surface Ab, Qual: NONREACTIVE

## 2017-09-27 LAB — HEPATITIS C ANTIBODY: Hep C Virus Ab: <0.1 s/co ratio (ref 0.0–0.9)

## 2017-09-28 LAB — CHLAMYDIA/GONOCOCCUS/TRICHOMONAS, NAA
Chlamydia by NAA: NEGATIVE
Gonococcus by NAA: NEGATIVE
Trich vag by NAA: NEGATIVE

## 2017-10-15 ENCOUNTER — Encounter: Payer: Self-pay | Admitting: Advanced Practice Midwife

## 2017-10-15 ENCOUNTER — Ambulatory Visit (INDEPENDENT_AMBULATORY_CARE_PROVIDER_SITE_OTHER): Payer: BLUE CROSS/BLUE SHIELD | Admitting: Advanced Practice Midwife

## 2017-10-15 ENCOUNTER — Ambulatory Visit (INDEPENDENT_AMBULATORY_CARE_PROVIDER_SITE_OTHER): Payer: BLUE CROSS/BLUE SHIELD

## 2017-10-15 VITALS — BP 110/60 | HR 63 | Ht 65.0 in | Wt 167.0 lb

## 2017-10-15 DIAGNOSIS — D271 Benign neoplasm of left ovary: Secondary | ICD-10-CM | POA: Diagnosis not present

## 2017-10-15 DIAGNOSIS — Z09 Encounter for follow-up examination after completed treatment for conditions other than malignant neoplasm: Secondary | ICD-10-CM | POA: Diagnosis not present

## 2017-10-15 DIAGNOSIS — R1032 Left lower quadrant pain: Secondary | ICD-10-CM | POA: Diagnosis not present

## 2017-10-15 NOTE — Progress Notes (Signed)
S: The patient is here today for follow up after ultrasound. She has a history of dermoid cyst and has had recent discomfort on the left lower abdomen. Discussion of results of ultrasound today and possible options for treatment ranging from wait and see to surgical removal. She is interested in talking with one of the doctors regarding surgical removal due to the discomfort she has related to the cyst. She remembers being told that the cyst was "very small" when it was first diagnosed. She has no other concerns or complaints today.  O: Vital Signs: BP 110/60   Pulse 63   Ht 5\' 5"  (1.651 m)   Wt 167 lb (75.8 kg)   LMP 09/26/2017 (Exact Date)   BMI 27.79 kg/m  Constitutional: Well nourished, well developed female in no acute distress.  HEENT: normal Skin: Warm and dry.   Respiratory: Normal respiratory effort Psych: Alert and Oriented x3. No memory deficits. Normal mood and affect.  MS: normal gait, normal bilateral lower extremity ROM/strength/stability.   ULTRASOUND REPORT  Patient Name: Bailey Watson DOB: 01-07-1991 MRN: [id number]  Location: [facility]  Date of Service: 10/15/2017    Indications:Left Dermoid Findings:  The uterus is anteverted and measures 8.34 x 5.41 x 4.63cm. Echo texture is homogenous without evidence of focal masses.  The Endometrium measures 4.36 mm.  Right Ovary measures 3.30 x 2.68 x 1.92 cm. It is normal in appearance. Left Ovary measures 6.14 x 5.27 x 3.97 cm. It is not normal in appearance. - there appears to be a mass with cystic and solid components suspicious for a dermoid tumor - measures 6.23 x 3.66cm Survey of the adnexa demonstrates no adnexal masses. There is no free fluid in the cul de sac.  Impression: 1. Left ovarian dermoid  Recommendations: 1.Clinical correlation with the patient's History and Physical Exam.  Edwena Bunde, RDMS, RVT   A: 27 year old G1 P1 with left dermoid cyst  P: Follow up with MD  regarding treatment options/possible surgical removal   Rod Can, CNM

## 2017-10-27 ENCOUNTER — Encounter: Payer: Self-pay | Admitting: Obstetrics and Gynecology

## 2017-10-27 ENCOUNTER — Ambulatory Visit (INDEPENDENT_AMBULATORY_CARE_PROVIDER_SITE_OTHER): Payer: BLUE CROSS/BLUE SHIELD | Admitting: Obstetrics and Gynecology

## 2017-10-27 VITALS — BP 110/66 | HR 69 | Ht 65.0 in | Wt 171.0 lb

## 2017-10-27 DIAGNOSIS — D271 Benign neoplasm of left ovary: Secondary | ICD-10-CM

## 2017-10-27 NOTE — Progress Notes (Signed)
Obstetrics & Gynecology Office Visit   Chief Complaint:  Chief Complaint  Patient presents with  . Dermoid Cyst    Referred by J.Gledhill    History of Present Illness: The patient is a 27 y.o. female presenting for evalution concerning a recently imaged left adnexal cyst.  Initial presentation was prompted by left lower quadrant pain.  Previous transvaginal ultrasound imaging demonstrated dimensions of 6.23cm x 3.66cm.  Appearance was notable complex appearance. The patient endorses associated symptoms of abdominal pain.  The patient denies associated symptoms of  early satiety, weight gain, weight loss, night sweats, vaginal bleeding, pelvic pressure, nausea and emesis.  There is not a notable family history of ovarian cancer, uterine cancer, breast cancer, or colon cancer.  She has been aware of the presence of a small dermoid since her last pregnancy 2 years ago.    Review of Systems: 10 point review of systems negative unless otherwise noted in HPI  Past Medical History:  Past Medical History:  Diagnosis Date  . BMI 40.0-44.9, adult ([place])   . Dermoid cyst of left ovary   . Herpes labialis   . Obesity     Past Surgical History:  Past Surgical History:  Procedure Laterality Date  . CESAREAN SECTION N/A 11/21/2015   Procedure: CESAREAN SECTION;  Surgeon: Will Bonnet, MD;  Location: [facility] ORS;  Service: Obstetrics;  Laterality: N/A;  . NO PAST SURGERIES      Gynecologic History: Patient's last menstrual period was 10/19/2017 (exact date).  Obstetric History: G1P0  Family History:  Family History  Problem Relation Age of Onset  . Diabetes Paternal Grandfather     Social History:  Social History   Socioeconomic History  . Marital status: Single    Spouse name: Not on file  . Number of children: Not on file  . Years of education: Not on file  . Highest education level: Not on file  Occupational History  . Not on file  Social Needs  . Financial resource  strain: Not on file  . Food insecurity:    Worry: Not on file    Inability: Not on file  . Transportation needs:    Medical: Not on file    Non-medical: Not on file  Tobacco Use  . Smoking status: Never Smoker  . Smokeless tobacco: Never Used  Substance and Sexual Activity  . Alcohol use: Yes    Comment: occasional  . Drug use: Yes    Frequency: 7.0 times per week    Types: Marijuana  . Sexual activity: Yes  Lifestyle  . Physical activity:    Days per week: Not on file    Minutes per session: Not on file  . Stress: Not on file  Relationships  . Social connections:    Talks on phone: Not on file    Gets together: Not on file    Attends religious service: Not on file    Active member of club or organization: Not on file    Attends meetings of clubs or organizations: Not on file    Relationship status: Not on file  . Intimate partner violence:    Fear of current or ex partner: Not on file    Emotionally abused: Not on file    Physically abused: Not on file    Forced sexual activity: Not on file  Other Topics Concern  . Not on file  Social History Narrative  . Not on file    Allergies:  No Known  Allergies  Medications: Prior to Admission medications   Not on File    Physical Exam Vitals:  Vitals:   10/27/17 1342  BP: 110/66  Pulse: 69   Patient's last menstrual period was 10/19/2017 (exact date).  General: NAD HEENT: normocephalic, anicteric Pulmonary: No increased work of breathing Neurologic: Grossly intact Psychiatric: mood appropriate, affect full  Female chaperone present for pelvic and breast  portions of the physical exam  US Pelvis Transvanginal Non-ob (tv Only)  Result Date: 10/15/2017 ULTRASOUND REPORT Patient Name: Bailey Watson DOB: July 07, 1990 MRN: [id number] Location: [facility] Date of Service: 10/15/2017 Indications:Left Dermoid Findings: The uterus is anteverted and measures 8.34 x 5.41 x 4.63cm. Echo texture is homogenous without  evidence of focal masses. The Endometrium measures 4.36 mm. Right Ovary measures 3.30 x 2.68 x 1.92 cm. It is normal in appearance. Left Ovary measures 6.14 x 5.27 x 3.97 cm. It is not normal in appearance. - there appears to be a mass with cystic and solid components suspicious for a dermoid tumor - measures 6.23 x 3.66cm Survey of the adnexa demonstrates no adnexal masses. There is no free fluid in the cul de sac. Impression: 1. Left ovarian dermoid Recommendations: 1.Clinical correlation with the patient's History and Physical Exam. Edwena Bunde, RDMS, RVT Images reviewed.  Large left ovarian dermoid tumor. [name], MD, [facility], [facility] 10/15/2017, 5:45 PM    Assessment: 27 y.o. G1P0 presenting for left ovarian dermoid cyst  Plan: Problem List Items Addressed This Visit    None    Visit Diagnoses    Mature cystic teratoma of ovary, left    -  Primary       1) The incidence and implication of adnexal masses and ovarian cysts were discussed with the patient in detail.  Prior imaging if available was reviewed at today's visit..  The vast majority of these lesions will represent benign or physiologic processes and may well resolve on repeat imaging with expectant management.  We discussed that in a premenopausal patient not on ovulation suppression with via a systemic form hormonal contraception the normal function of the ovary during follicular development is the formation of a dominant follicle or cyst(s) every month.  This is an essential part of normal reproductive physiology.  In some cases these cysts can take on larger dimensions, hemorrhage, or undergo torsion making them symptomatic. Torsion is relatively unlikely for lesions under 5 cm.  Based on initial imaging findings the overall concern for malignancy is deemed low.  We will obtain follow up imagine approximately 6 weeks from the date of the initial imaging study.  Torsion precautions were  given.   - given appearance most consistent with dermoid we discussed that this particularly cyst was unlikely to resolve, gradually increase in size. - will post for laparoscopic left ovarian cystectomy  2) Tumor makers were not ordered  3) A total of 15 minutes were spent in face-to-face contact with the patient during this encounter with over half of that time devoted to counseling and coordination of care.  4) Return if symptoms worsen or fail to improve.   [name], MD, [facility], [facility] 10/27/2017, 4:56 PM

## 2017-10-28 ENCOUNTER — Telehealth: Payer: Self-pay | Admitting: Obstetrics and Gynecology

## 2017-10-28 NOTE — Telephone Encounter (Signed)
Patient is aware of H&P at [facility] on 11/07/17 @ 3:50pm w/ Dr. Georgianne Fick, [place] phone interview to be scheduled, and OR on 11/13/17. Patient is aware she may receive calls from the [facility] [place] and [facility]. Patient confirmed she has BCBS and secondary Medicaid FPW. Patient is aware FPW will not pay.

## 2017-10-28 NOTE — Telephone Encounter (Signed)
-----   Message from [name], MD sent at 10/27/2017  4:56 PM EDT ----- Regarding: Surgery Surgery Date: 1-4 weeks  LOS: same day surgery  Surgery Booking Request Patient Full Name: JEMEKA WAGLER MRN: [id number]  DOB: [birth year]  Surgeon: [name], MD  Requested Surgery Date and Time: 1-4 weeks Primary Diagnosis and Code: Mature teratoma left ovary Secondary Diagnosis and Code:  Surgical Procedure: laparoscopic left ovarian cystectomy L&D Notification:N/A Admission Status: same day surgery Length of Surgery: 1h Special Case Needs: none H&P:  (date) Phone Interview or Office Pre-Admit: phone interview Interpreter: No Language: English Medical Clearance: No Special Scheduling Instructions: none

## 2017-11-06 ENCOUNTER — Other Ambulatory Visit: Payer: BLUE CROSS/BLUE SHIELD

## 2017-11-07 ENCOUNTER — Ambulatory Visit (INDEPENDENT_AMBULATORY_CARE_PROVIDER_SITE_OTHER): Payer: BLUE CROSS/BLUE SHIELD | Admitting: Obstetrics and Gynecology

## 2017-11-07 ENCOUNTER — Encounter: Payer: Self-pay | Admitting: Obstetrics and Gynecology

## 2017-11-07 VITALS — BP 124/70 | HR 73 | Ht 65.0 in | Wt 169.0 lb

## 2017-11-07 DIAGNOSIS — Z01818 Encounter for other preprocedural examination: Secondary | ICD-10-CM

## 2017-11-07 DIAGNOSIS — D271 Benign neoplasm of left ovary: Secondary | ICD-10-CM | POA: Diagnosis not present

## 2017-11-07 NOTE — Progress Notes (Signed)
Obstetrics & Gynecology Surgery H&P    Chief Complaint: Scheduled Surgery   History of Present Illness: Patient is a 27 y.o. G1P0 presenting for scheduled laparoscopic left ovarian cystectomy, for the treatment or further evaluation of left ovarian dermoid.   Prior Treatments prior to proceeding with surgery include: ultrasound diagnosis  Preoperative Pap: 01/05/2016 Results: no abnormalities  Preoperative Endometrial biopsy: N/A Preoperative Ultrasound: 10/15/2017 Findings: Left ovarian dermoid tymor 6.23 x 3.66cm  Review of Systems:10 point review of systems  Past Medical History:  Past Medical History:  Diagnosis Date  . BMI 40.0-44.9, adult ([facility])   . Dermoid cyst of left ovary   . Herpes labialis   . Obesity     Past Surgical History:  Past Surgical History:  Procedure Laterality Date  . CESAREAN SECTION N/A 11/21/2015   Procedure: CESAREAN SECTION;  Surgeon: Bailey Bonnet, MD;  Location: [facility] ORS;  Service: Obstetrics;  Laterality: N/A;  . NO PAST SURGERIES      Family History:  Family History  Problem Relation Age of Onset  . Diabetes Paternal Grandfather     Social History:  Social History   Socioeconomic History  . Marital status: Single    Spouse name: Not on file  . Number of children: Not on file  . Years of education: Not on file  . Highest education level: Not on file  Occupational History  . Not on file  Social Needs  . Financial resource strain: Not on file  . Food insecurity:    Worry: Not on file    Inability: Not on file  . Transportation needs:    Medical: Not on file    Non-medical: Not on file  Tobacco Use  . Smoking status: Never Smoker  . Smokeless tobacco: Never Used  Substance and Sexual Activity  . Alcohol use: Yes    Comment: occasional  . Drug use: Yes    Frequency: 7.0 times per week    Types: Marijuana  . Sexual activity: Yes  Lifestyle  . Physical activity:    Days per week: Not on file    Minutes per session:  Not on file  . Stress: Not on file  Relationships  . Social connections:    Talks on phone: Not on file    Gets together: Not on file    Attends religious service: Not on file    Active member of club or organization: Not on file    Attends meetings of clubs or organizations: Not on file    Relationship status: Not on file  . Intimate partner violence:    Fear of current or ex partner: Not on file    Emotionally abused: Not on file    Physically abused: Not on file    Forced sexual activity: Not on file  Other Topics Concern  . Not on file  Social History Narrative  . Not on file    Allergies:  No Known Allergies  Medications: Prior to Admission medications   Not on File    Physical Exam Vitals: Blood pressure 124/70, pulse 73, height 5\' 5"  (1.651 m), weight 169 lb (76.7 kg), last menstrual period 10/19/2017, unknown if currently breastfeeding. General: NAD HEENT: normocephalic, anicteric Pulmonary: CTAB, No increased work of breathing Cardiovascular: RRR, distal pulses 2+ Abdomen: soft, non-tender, non-distended Genitourinary: deferred Extremities: no edema, erythema, or tenderness Neurologic: Grossly intact Psychiatric: Watson appropriate, affect full  Imaging US Pelvis Transvanginal Non-ob (tv Only)  Result Date: 10/15/2017 ULTRASOUND REPORT Patient  Name: Bailey Watson DOB: May 13, 2021 MRN: [id number] Location: [facility] Date of Service: 10/15/2017 Indications:Left Dermoid Findings: The uterus is anteverted and measures 8.34 x 5.41 x 4.63cm. Echo texture is homogenous without evidence of focal masses. The Endometrium measures 4.36 mm. Right Ovary measures 3.30 x 2.68 x 1.92 cm. It is normal in appearance. Left Ovary measures 6.14 x 5.27 x 3.97 cm. It is not normal in appearance. - there appears to be a mass with cystic and solid components suspicious for a dermoid tumor - measures 6.23 x 3.66cm Survey of the adnexa demonstrates no adnexal masses. There is no free fluid  in the cul de sac. Impression: 1. Left ovarian dermoid Recommendations: 1.Clinical correlation with the patient's History and Physical Exam. Bailey Watson, RDMS, RVT Images reviewed.  Large left ovarian dermoid tumor. Bailey Mood, MD, [facility], [facility] 10/15/2017, 5:45 PM    Assessment: 27 y.o. G1P0 presenting for scheduled laparoscopic left ovarian cystectomy  Problem List Items Addressed This Visit    None    Visit Diagnoses    Preoperative exam for gynecologic surgery    -  Primary   Dermoid cyst of left ovary           Plan: 1) I have had a careful discussion with this patient about all the options available and the risk/benefits of each. I have fully informed this patient that a laparoscopy may subject her to a variety of discomforts and risks: She understands that most patients have surgery with little difficulty, but problems can happen ranging from minor to fatal. These include nausea, vomiting, pain, bleeding, infection, poor healing, hernia, or formation of adhesions. Unexpected reactions may occur from any drug or anesthetic given. Unintended injury may occur to other pelvic or abdominal structures such as Fallopian tubes, ovaries, bladder, ureter (tube from kidney to bladder), or bowel. Nerves going from the pelvis to the legs may be injured. Any such injury may require immediate or later additional surgery to correct the problem. Excessive blood loss requiring transfusion is very unlikely but possible. Dangerous blood clots may form in the legs or lungs. Physical and sexual activity Bailey be restricted in varying degrees for an indeterminate period of time but most often 2-4 weeks. She understands that the plan is to do this laparoscopically, however, there is a chance that this Bailey need to be performed via a larger incision. Finally, she understands that it is impossible to list every possible undesirable effect and that the condition for which  surgery is done is not always cured or significantly improved, and in rare cases may be even worsen. Ample time was given to answer all questions.  2) Routine postoperative instructions were reviewed with the patient and her family in detail today including the expected length of recovery and likely postoperative course.  The patient concurred with the proposed plan, giving informed written consent for the surgery today.  Patient instructed on the importance of being NPO after midnight prior to her procedure.  If warranted preoperative prophylactic antibiotics and SCDs ordered on call to the OR to meet SCIP guidelines and adhere to recommendation laid forth in [place] Number 104 May 2009  "Antibiotic Prophylaxis for Gynecologic Procedures".     Bailey Mood, MD, [facility], [facility] 11/07/2017, 4:34 PM

## 2017-11-10 ENCOUNTER — Encounter
Admission: RE | Admit: 2017-11-10 | Discharge: 2017-11-10 | Disposition: A | Discharge status: Home / Self Care | Payer: BLUE CROSS/BLUE SHIELD | Source: Ambulatory Visit | Attending: Obstetrics and Gynecology | Admitting: Obstetrics and Gynecology

## 2017-11-10 ENCOUNTER — Other Ambulatory Visit: Payer: Self-pay

## 2017-11-10 NOTE — Patient Instructions (Signed)
Your procedure is scheduled on: 11/13/17 Report to Day Surgery.  [facility] SECOND FLOOR To find out your arrival time please call [phone] between 1PM - 3PM on 11/12/17.  Remember: Instructions that are not followed completely may result in serious medical risk,  up to and including death, or upon the discretion of your surgeon and anesthesiologist your  surgery may need to be rescheduled.     _X__ 1. Do not eat food after midnight the night before your procedure.                 No gum chewing or hard candies. You may drink clear liquids up to 2 hours                 before you are scheduled to arrive for your surgery- DO not drink clear                 liquids within 2 hours of the start of your surgery.                 Clear Liquids include:  water, apple juice without pulp, clear carbohydrate                 drink such as Clearfast of Gatorade, Black Coffee or Tea (Do not add                 anything to coffee or tea).  __X__2.  On the morning of surgery brush your teeth with toothpaste and water, you                may rinse your mouth with mouthwash if you wish.  Do not swallow any toothpaste of mouthwash.     _X__ 3.  No Alcohol for 24 hours before or after surgery.   _X__ 4.  Do Not Smoke or use e-cigarettes For 24 Hours Prior to Your Surgery.                 Do not use any chewable tobacco products for at least 6 hours prior to                 surgery.  ____  5.  Bring all medications with you on the day of surgery if instructed.   __X__  6.  Notify your doctor if there is any change in your medical condition      (cold, fever, infections).     Do not wear jewelry, make-up, hairpins, clips or nail polish. Do not wear lotions, powders, or perfumes. You may wear deodorant. Do not shave 48 hours prior to surgery. Men may shave face and neck. Do not bring valuables to the hospital.    [facility] is not responsible for any belongings or  valuables.  Contacts, dentures or bridgework may not be worn into surgery. Leave your suitcase in the car. After surgery it may be brought to your room. For patients admitted to the hospital, discharge time is determined by your treatment team.   Patients discharged the day of surgery will not be allowed to drive home.    ____ Take these medicines the morning of surgery with A SIP OF WATER:    1. NONE  2.   3.   4.  5.  6.  ____ Fleet Enema (as directed)   ____ Use CHG Soap as directed  ____ Use inhalers on the day of surgery  ____ Stop metformin 2 days prior to surgery  ____ Take 1/2 of usual insulin dose the night before surgery. No insulin the morning          of surgery.   ____ Stop Coumadin/Plavix/aspirin on ____ Stop Anti-inflammatories on    ____ Stop supplements until after surgery.    ____ Bring C-Pap to the hospital.

## 2017-11-13 ENCOUNTER — Encounter
Admission: RE | Disposition: A | Discharge status: Home / Self Care | Payer: Self-pay | Source: Ambulatory Visit | Attending: Obstetrics and Gynecology

## 2017-11-13 ENCOUNTER — Ambulatory Visit
Admission: RE | Admit: 2017-11-13 | Discharge: 2017-11-13 | Disposition: A | Discharge status: Home / Self Care | Payer: BLUE CROSS/BLUE SHIELD | Source: Ambulatory Visit | Attending: Obstetrics and Gynecology | Admitting: Obstetrics and Gynecology

## 2017-11-13 ENCOUNTER — Ambulatory Visit: Payer: BLUE CROSS/BLUE SHIELD | Admitting: Anesthesiology

## 2017-11-13 ENCOUNTER — Encounter: Payer: Self-pay | Admitting: *Deleted

## 2017-11-13 ENCOUNTER — Other Ambulatory Visit: Payer: Self-pay

## 2017-11-13 DIAGNOSIS — Z6841 Body Mass Index (BMI) 40.0 and over, adult: Secondary | ICD-10-CM | POA: Insufficient documentation

## 2017-11-13 DIAGNOSIS — D271 Benign neoplasm of left ovary: Secondary | ICD-10-CM | POA: Insufficient documentation

## 2017-11-13 DIAGNOSIS — Z9889 Other specified postprocedural states: Secondary | ICD-10-CM

## 2017-11-13 HISTORY — PX: LAPAROSCOPIC OVARIAN CYSTECTOMY: SHX6248

## 2017-11-13 LAB — URINE DRUG SCREEN, QUALITATIVE (ARMC ONLY)
Amphetamines, Ur Screen: NOT DETECTED
Barbiturates, Ur Screen: NOT DETECTED
Benzodiazepine, Ur Scrn: NOT DETECTED
CANNABINOID 50 NG, UR ~~LOC~~: POSITIVE — AB
COCAINE METABOLITE, UR ~~LOC~~: NOT DETECTED
MDMA (ECSTASY) UR SCREEN: NOT DETECTED
Methadone Scn, Ur: NOT DETECTED
Opiate, Ur Screen: NOT DETECTED
PHENCYCLIDINE (PCP) UR S: NOT DETECTED
Tricyclic, Ur Screen: NOT DETECTED

## 2017-11-13 LAB — POCT PREGNANCY, URINE: PREG TEST UR: NEGATIVE

## 2017-11-13 SURGERY — EXCISION, CYST, OVARY, LAPAROSCOPIC
Anesthesia: General | Laterality: Left | Wound class: Clean Contaminated

## 2017-11-13 MED ORDER — DEXAMETHASONE SODIUM PHOSPHATE 10 MG/ML IJ SOLN
INTRAMUSCULAR | Status: DC | PRN
  Administered 2017-11-13: 5 mg via INTRAVENOUS

## 2017-11-13 MED ORDER — FENTANYL CITRATE (PF) 100 MCG/2ML IJ SOLN
INTRAMUSCULAR | Status: DC | PRN
  Administered 2017-11-13 (×4): 50 ug via INTRAVENOUS

## 2017-11-13 MED ORDER — FENTANYL CITRATE (PF) 100 MCG/2ML IJ SOLN
25.0000 ug | INTRAMUSCULAR | Status: DC | PRN
  Administered 2017-11-13: 25 ug via INTRAVENOUS

## 2017-11-13 MED ORDER — PROPOFOL 10 MG/ML IV BOLUS
INTRAVENOUS | Status: DC | PRN
  Administered 2017-11-13: 250 mg via INTRAVENOUS

## 2017-11-13 MED ORDER — FAMOTIDINE 20 MG PO TABS
20.0000 mg | ORAL_TABLET | Freq: Once | ORAL | Status: AC
  Administered 2017-11-13: 20 mg via ORAL

## 2017-11-13 MED ORDER — ACETAMINOPHEN NICU IV SYRINGE 10 MG/ML
INTRAVENOUS | Status: AC
  Filled 2017-11-13: qty 1

## 2017-11-13 MED ORDER — SUGAMMADEX SODIUM 200 MG/2ML IV SOLN
INTRAVENOUS | Status: DC | PRN
  Administered 2017-11-13: 150 mg via INTRAVENOUS

## 2017-11-13 MED ORDER — KETOROLAC TROMETHAMINE 30 MG/ML IJ SOLN
INTRAMUSCULAR | Status: AC
  Filled 2017-11-13: qty 2

## 2017-11-13 MED ORDER — DIPHENHYDRAMINE HCL 50 MG/ML IJ SOLN
INTRAMUSCULAR | Status: AC
  Filled 2017-11-13: qty 1

## 2017-11-13 MED ORDER — DEXMEDETOMIDINE HCL IN NACL 80 MCG/20ML IV SOLN
INTRAVENOUS | Status: AC
  Filled 2017-11-13: qty 20

## 2017-11-13 MED ORDER — FENTANYL CITRATE (PF) 100 MCG/2ML IJ SOLN
INTRAMUSCULAR | Status: AC
  Filled 2017-11-13: qty 2

## 2017-11-13 MED ORDER — MIDAZOLAM HCL 2 MG/2ML IJ SOLN
INTRAMUSCULAR | Status: DC | PRN
  Administered 2017-11-13: 2 mg via INTRAVENOUS

## 2017-11-13 MED ORDER — ROCURONIUM BROMIDE 100 MG/10ML IV SOLN
INTRAVENOUS | Status: DC | PRN
  Administered 2017-11-13: 10 mg via INTRAVENOUS
  Administered 2017-11-13: 40 mg via INTRAVENOUS

## 2017-11-13 MED ORDER — BUPIVACAINE HCL (PF) 0.5 % IJ SOLN
INTRAMUSCULAR | Status: AC
  Filled 2017-11-13: qty 30

## 2017-11-13 MED ORDER — SUGAMMADEX SODIUM 200 MG/2ML IV SOLN
INTRAVENOUS | Status: AC
  Filled 2017-11-13: qty 2

## 2017-11-13 MED ORDER — FENTANYL CITRATE (PF) 100 MCG/2ML IJ SOLN
INTRAMUSCULAR | Status: AC
  Administered 2017-11-13: 25 ug via INTRAVENOUS
  Filled 2017-11-13: qty 2

## 2017-11-13 MED ORDER — LIDOCAINE HCL (CARDIAC) PF 100 MG/5ML IV SOSY
PREFILLED_SYRINGE | INTRAVENOUS | Status: DC | PRN
  Administered 2017-11-13: 100 mg via INTRAVENOUS

## 2017-11-13 MED ORDER — OXYCODONE-ACETAMINOPHEN 5-325 MG PO TABS: 1.0000 | ORAL_TABLET | ORAL | 0 refills | Status: DC | PRN

## 2017-11-13 MED ORDER — DIPHENHYDRAMINE HCL 50 MG/ML IJ SOLN
INTRAMUSCULAR | Status: DC | PRN
  Administered 2017-11-13: 12.5 mg via INTRAVENOUS

## 2017-11-13 MED ORDER — KETOROLAC TROMETHAMINE 30 MG/ML IJ SOLN
INTRAMUSCULAR | Status: DC | PRN
Start: 2017-11-13 — End: 2017-11-13
  Administered 2017-11-13: 30 mg via INTRAVENOUS

## 2017-11-13 MED ORDER — PHENYLEPHRINE HCL 10 MG/ML IJ SOLN
INTRAMUSCULAR | Status: DC | PRN
  Administered 2017-11-13: 50 ug via INTRAVENOUS

## 2017-11-13 MED ORDER — ACETAMINOPHEN 10 MG/ML IV SOLN
INTRAVENOUS | Status: DC | PRN
  Administered 2017-11-13: 1000 mg via INTRAVENOUS

## 2017-11-13 MED ORDER — ONDANSETRON HCL 4 MG/2ML IJ SOLN: 4.0000 mg | Freq: Once | INTRAMUSCULAR | Status: DC | PRN

## 2017-11-13 MED ORDER — ONDANSETRON HCL 4 MG/2ML IJ SOLN
INTRAMUSCULAR | Status: DC | PRN
  Administered 2017-11-13: 4 mg via INTRAVENOUS

## 2017-11-13 MED ORDER — LACTATED RINGERS IV SOLN
INTRAVENOUS | Status: DC
  Administered 2017-11-13 (×2): via INTRAVENOUS

## 2017-11-13 MED ORDER — BUPIVACAINE HCL 0.5 % IJ SOLN
INTRAMUSCULAR | Status: DC | PRN
  Administered 2017-11-13: 13 mL

## 2017-11-13 MED ORDER — IBUPROFEN 600 MG PO TABS: 600.0000 mg | ORAL_TABLET | Freq: Four times a day (QID) | ORAL | 3 refills | Status: DC | PRN

## 2017-11-13 MED ORDER — FAMOTIDINE 20 MG PO TABS
ORAL_TABLET | ORAL | Status: AC
  Administered 2017-11-13: 20 mg via ORAL
  Filled 2017-11-13: qty 1

## 2017-11-13 MED ORDER — DEXMEDETOMIDINE HCL 200 MCG/2ML IV SOLN
INTRAVENOUS | Status: DC | PRN
  Administered 2017-11-13: 12 ug via INTRAVENOUS
  Administered 2017-11-13 (×2): 4 ug via INTRAVENOUS

## 2017-11-13 MED ORDER — MIDAZOLAM HCL 2 MG/2ML IJ SOLN
INTRAMUSCULAR | Status: AC
  Filled 2017-11-13: qty 2

## 2017-11-13 SURGICAL SUPPLY — 38 items
ANCHOR TIS RET SYS 235ML (MISCELLANEOUS) ×3 IMPLANT
BAG URINE DRAINAGE (UROLOGICAL SUPPLIES) ×3 IMPLANT
BLADE SURG SZ11 CARB STEEL (BLADE) ×3 IMPLANT
CANISTER SUCT 1200ML W/VALVE (MISCELLANEOUS) ×3 IMPLANT
CATH FOLEY 2WAY  5CC 16FR (CATHETERS) ×2
CATH URTH 16FR FL 2W BLN LF (CATHETERS) ×1 IMPLANT
CHLORAPREP W/TINT 26ML (MISCELLANEOUS) ×3 IMPLANT
DERMABOND ADVANCED (GAUZE/BANDAGES/DRESSINGS) ×2
DERMABOND ADVANCED .7 DNX12 (GAUZE/BANDAGES/DRESSINGS) ×1 IMPLANT
DRESSING SURGICEL FIBRLLR 1X2 (HEMOSTASIS) ×1 IMPLANT
DRSG SURGICEL FIBRILLAR 1X2 (HEMOSTASIS) ×3
GLOVE BIO SURGEON STRL SZ7 (GLOVE) ×9 IMPLANT
GLOVE INDICATOR 7.5 STRL GRN (GLOVE) ×9 IMPLANT
GOWN STRL REUS W/ TWL LRG LVL3 (GOWN DISPOSABLE) ×3 IMPLANT
GOWN STRL REUS W/ TWL XL LVL3 (GOWN DISPOSABLE) IMPLANT
GOWN STRL REUS W/TWL LRG LVL3 (GOWN DISPOSABLE) ×6
GOWN STRL REUS W/TWL XL LVL3 (GOWN DISPOSABLE)
GRASPER SUT TROCAR 14GX15 (MISCELLANEOUS) IMPLANT
IRRIGATION STRYKERFLOW (MISCELLANEOUS) ×1 IMPLANT
IRRIGATOR STRYKERFLOW (MISCELLANEOUS) ×3
IV LACTATED RINGERS 1000ML (IV SOLUTION) ×3 IMPLANT
KIT PINK PAD W/HEAD ARE REST (MISCELLANEOUS) ×3
KIT PINK PAD W/HEAD ARM REST (MISCELLANEOUS) ×1 IMPLANT
KIT TURNOVER CYSTO (KITS) ×3 IMPLANT
LABEL OR SOLS (LABEL) IMPLANT
NS IRRIG 500ML POUR BTL (IV SOLUTION) ×3 IMPLANT
PACK GYN LAPAROSCOPIC (MISCELLANEOUS) ×3 IMPLANT
PAD OB MATERNITY 4.3X12.25 (PERSONAL CARE ITEMS) ×3 IMPLANT
PAD PREP 24X41 OB/GYN DISP (PERSONAL CARE ITEMS) ×3 IMPLANT
SCISSORS METZENBAUM CVD 33 (INSTRUMENTS) ×3 IMPLANT
SHEARS HARMONIC ACE PLUS 36CM (ENDOMECHANICALS) ×3 IMPLANT
SLEEVE ENDOPATH XCEL 5M (ENDOMECHANICALS) ×3 IMPLANT
SUT MNCRL AB 4-0 PS2 18 (SUTURE) ×3 IMPLANT
SUT VIC AB 0 CT2 27 (SUTURE) ×3 IMPLANT
SUT VIC AB 2-0 UR6 27 (SUTURE) ×3 IMPLANT
TROCAR ENDO BLADELESS 11MM (ENDOMECHANICALS) ×3 IMPLANT
TROCAR XCEL NON-BLD 5MMX100MML (ENDOMECHANICALS) ×3 IMPLANT
TUBING INSUFFLATION (TUBING) ×3 IMPLANT

## 2017-11-13 NOTE — Op Note (Addendum)
Preoperative Diagnosis: 1) 27 y.o. with left dermoid cyst  Postoperative Diagnosis: 1) 27 y.o. left dermoid cyst  Operation Performed: Laparoscopic left ovarian cystectomy  Indication: 27 y.o. G1P0 with ultrasound imaged ovarian dermoid cyst  Surgeon: [name], MD  Anesthesia: General  Preoperative Antibiotics: none  Estimated Blood Loss: 5 mL  IV Fluids: 1L  Urine Output:: 336mL  Drains or Tubes: none  Implants: none  Specimens Removed: left dermoid cyst  Complications: none  Intraoperative Findings: Normal tubes, right ovaries, and uterus.  Left ovarian dermoid cyst  Patient Condition: stable  Procedure in Detail:  Patient was taken to the operating room where she was administered general anesthesia.  She was positioned in the dorsal lithotomy position utilizing Allen stirups, prepped and draped in the usual sterile fashion.  Prior to proceeding with procedure a time out was performed.  Attention was turned to the patient's pelvis.  A red rubber catheter was used to empty the patient's bladder.  An operative speculum was placed to allow visualization of the cervix.  The anterior lip of the cervix was grasped with a single tooth tenaculum, and a Hulka tenaculum was placed to allow manipulation of the uterus.  The operative speculum and single tooth tenaculum were then removed.  Attention was turned to the patient's abdomen.  The umbilicus was infiltrated with 1% Sensorcaine, before making a stab incision using an 11 blade scalpel.  A 36mm Excel trocar was then used to gain direct entry into the peritoneal cavity utilizing the camera to visualize progress of the trocar during placement.  Once peritoneal entry had been achieved, insufflation was started and pneumoperitoneum established at a pressure of 73mmHg. One additional 67mm excel trocar was placed under direct visualiztion in the left upper quadrant followed by one 11 mm trocar in the left lower quadrant.  General  inspection of the abdomen revealed the above noted findings.   The left ovary was elevated out of the pelvis.  The cortex was incised using a harmonic scalpel.  The Edges of the cortex were grasped with Leda Gauze grasper and the cortex shelled of the cyst using laparoscopic scissors, blunt dissection, and hydro-dissection.  The cyst wall did rupture during removal but was otherwise removed intact.  Fibrillar was placed in the cyst bed following removal of the cyst.  Pneumoperitoneum was was dropped with hemostasis noted, before re-establishing at 66mmHg to close the fascia on the 28mm port site..    The 53mm trocar site was closed using a Carter-Thompson and 0 Vicryl for the fascia with 4-0 Monocryl in a subcuticular fashion for the skin. Pneumoperitoneum was evacuated.  The trocars were removed.  All trocar sites were then dressed with surgical skin glue.  The Hulka tenaculum was removed.  Sponge needle and instrument counts were correct time two.  The patient tolerated the procedure well and was taken to the recovery room in stable condition.

## 2017-11-13 NOTE — Anesthesia Preprocedure Evaluation (Signed)
Anesthesia Evaluation  Patient identified by MRN, date of birth, ID band Patient awake    Reviewed: Allergy & Precautions, H&P , NPO status , Patient's Chart, lab work & pertinent test results, reviewed documented beta blocker date and time   Airway Mallampati: III  TM Distance: >3 FB Neck ROM: full    Dental no notable dental hx. (+) Teeth Intact   Pulmonary neg pulmonary ROS,    Pulmonary exam normal breath sounds clear to auscultation       Cardiovascular Exercise Tolerance: Good negative cardio ROS Normal cardiovascular exam Rhythm:regular Rate:Normal     Neuro/Psych negative neurological ROS  negative psych ROS   GI/Hepatic Neg liver ROS, GERD  ,  Endo/Other  Morbid obesity  Renal/GU negative Renal ROS  negative genitourinary   Musculoskeletal negative musculoskeletal ROS (+)   Abdominal   Peds  Hematology negative hematology ROS (+)   Anesthesia Other Findings Past Medical History: No date: BMI 40.0-44.9, adult ([facility]) No date: 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 No date: Obesity   Reproductive/Obstetrics negative OB ROS                             Anesthesia Physical  Anesthesia Plan  ASA: II  Anesthesia Plan: General   Post-op Pain Management:    Induction: Intravenous  PONV Risk Score and Plan:   Airway Management Planned: Oral ETT  Additional Equipment:   Intra-op Plan:   Post-operative Plan: Extubation in OR  Informed Consent: I have reviewed the patients History and Physical, chart, labs and discussed the procedure including the risks, benefits and alternatives for the proposed anesthesia with the patient or authorized representative who has indicated his/her understanding and acceptance.   Dental Advisory Given  Plan Discussed with: Anesthesiologist, CRNA and Surgeon  Anesthesia Plan Comments:         Anesthesia Quick  Evaluation

## 2017-11-13 NOTE — H&P (Signed)
Date of Initial H&P: 11/07/17  History reviewed, patient examined, no change in status, stable for surgery.

## 2017-11-13 NOTE — Anesthesia Post-op Follow-up Note (Signed)
Anesthesia QCDR form completed.        

## 2017-11-13 NOTE — Transfer of Care (Signed)
Immediate Anesthesia Transfer of Care Note  Patient: Bailey Watson  Procedure(s) Performed: LAPAROSCOPIC OVARIAN CYSTECTOMY (Left )  Patient Location: PACU  Anesthesia Type:General  Level of Consciousness: sedated, unresponsive and drowsy  Airway & Oxygen Therapy: Patient Spontanous Breathing and Patient connected to face mask oxygen  Post-op Assessment: Report given to RN and Post -op Vital signs reviewed and stable  Post vital signs: Reviewed and stable  Last Vitals:  Vitals Value Taken Time  BP 110/80 11/13/2017  2:59 PM  Temp 36.2 C 11/13/2017  2:59 PM  Pulse 81 11/13/2017  3:05 PM  Resp 22 11/13/2017  3:05 PM  SpO2 100 % 11/13/2017  3:05 PM  Vitals shown include unvalidated device data.  Last Pain:  Vitals:   11/13/17 1459  TempSrc:   PainSc: 0-No pain      Patients Stated Pain Goal: 0 (98/26/41 5830)  Complications: No apparent anesthesia complications

## 2017-11-13 NOTE — Anesthesia Postprocedure Evaluation (Signed)
Anesthesia Post Note  Patient: Bailey Watson  Procedure(s) Performed: LAPAROSCOPIC OVARIAN CYSTECTOMY (Left )  Patient location during evaluation: PACU Anesthesia Type: General Level of consciousness: awake and alert Pain management: pain level controlled Vital Signs Assessment: post-procedure vital signs reviewed and stable Respiratory status: spontaneous breathing, nonlabored ventilation, respiratory function stable and patient connected to nasal cannula oxygen Cardiovascular status: blood pressure returned to baseline and stable Postop Assessment: no apparent nausea or vomiting Anesthetic complications: no     Last Vitals:  Vitals:   11/13/17 1514 11/13/17 1529  BP: 124/75 117/80  Pulse: 76 61  Resp: 16 15  Temp:    SpO2: 100% 100%    Last Pain:  Vitals:   11/13/17 1529  TempSrc:   PainSc: 0-No pain                 [name] S

## 2017-11-13 NOTE — Discharge Instructions (Signed)

## 2017-11-13 NOTE — Anesthesia Procedure Notes (Signed)
Procedure Name: Intubation Date/Time: 11/13/2017 1:07 PM Performed by: Christain Sacramento, RN Pre-anesthesia Checklist: Patient identified, Emergency Drugs available, Suction available, Patient being monitored and Timeout performed Patient Re-evaluated:Patient Re-evaluated prior to induction Oxygen Delivery Method: Circle system utilized Preoxygenation: Pre-oxygenation with 100% oxygen Induction Type: IV induction Ventilation: Mask ventilation without difficulty Laryngoscope Size: Mac and 3 Grade View: Grade I Tube type: Oral Tube size: 7.0 mm Number of attempts: 1 Airway Equipment and Method: Stylet Placement Confirmation: ETT inserted through vocal cords under direct vision,  positive ETCO2 and breath sounds checked- equal and bilateral Secured at: 21 cm Tube secured with: Tape Dental Injury: Teeth and Oropharynx as per pre-operative assessment

## 2017-11-14 ENCOUNTER — Encounter: Payer: Self-pay | Admitting: Obstetrics and Gynecology

## 2017-11-17 LAB — SURGICAL PATHOLOGY

## 2017-11-21 ENCOUNTER — Ambulatory Visit (INDEPENDENT_AMBULATORY_CARE_PROVIDER_SITE_OTHER): Payer: BLUE CROSS/BLUE SHIELD | Admitting: Obstetrics and Gynecology

## 2017-11-21 ENCOUNTER — Encounter: Payer: Self-pay | Admitting: Obstetrics and Gynecology

## 2017-11-21 VITALS — BP 118/62 | HR 65 | Wt 167.0 lb

## 2017-11-21 DIAGNOSIS — O99345 Other mental disorders complicating the puerperium: Secondary | ICD-10-CM

## 2017-11-21 DIAGNOSIS — Z9889 Other specified postprocedural states: Secondary | ICD-10-CM

## 2017-11-21 DIAGNOSIS — Z4889 Encounter for other specified surgical aftercare: Secondary | ICD-10-CM

## 2017-11-21 DIAGNOSIS — F53 Postpartum depression: Secondary | ICD-10-CM

## 2017-11-22 MED ORDER — LIDOCAINE-PRILOCAINE 2.5-2.5 % EX CREA: 1.0000 "application " | TOPICAL_CREAM | CUTANEOUS | 0 refills | Status: DC | PRN

## 2017-11-22 NOTE — Progress Notes (Addendum)
Postoperative Follow-up Patient presents post op from laparoscopic left ovarian cystectomy 1weeks ago for ovarian dermoid cyst.  Subjective: Patient reports marked improvement in her preop symptoms. Eating a regular diet without difficulty. Pain is controlled without any medications.  Activity: normal activities of daily living.  Objective: Blood pressure 118/62, pulse 65, weight 167 lb (75.8 kg).  Gen: NAD HEENT: normocephalic, anicteric Pulmonary: CTAB Abdomen: soft, non-tender, non-distended, trocar sites D/C/I  Admission on 11/13/2017, Discharged on 11/13/2017  Component Date Value Ref Range Status  . Tricyclic, Ur Screen 18/29/9371 NONE DETECTED  NONE DETECTED Final  . Amphetamines, Ur Screen 11/13/2017 NONE DETECTED  NONE DETECTED Final  . MDMA (Ecstasy)Ur Screen 11/13/2017 NONE DETECTED  NONE DETECTED Final  . Cocaine Metabolite,Ur [place] 11/13/2017 NONE DETECTED  NONE DETECTED Final  . Opiate, Ur Screen 11/13/2017 NONE DETECTED  NONE DETECTED Final  . Phencyclidine (PCP) Ur S 11/13/2017 NONE DETECTED  NONE DETECTED Final  . Cannabinoid 50 Ng, Ur  11/13/2017 POSITIVE* NONE DETECTED Final  . Barbiturates, Ur Screen 11/13/2017 NONE DETECTED  NONE DETECTED Final  . Benzodiazepine, Ur Scrn 11/13/2017 NONE DETECTED  NONE DETECTED Final  . Methadone Scn, Ur 11/13/2017 NONE DETECTED  NONE DETECTED Final   Comment: (NOTE) Tricyclics + metabolites, urine    Cutoff 1000 ng/mL Amphetamines + metabolites, urine  Cutoff 1000 ng/mL MDMA (Ecstasy), urine              Cutoff 500 ng/mL Cocaine Metabolite, urine          Cutoff 300 ng/mL Opiate + metabolites, urine        Cutoff 300 ng/mL Phencyclidine (PCP), urine         Cutoff 25 ng/mL Cannabinoid, urine                 Cutoff 50 ng/mL Barbiturates + metabolites, urine  Cutoff 200 ng/mL Benzodiazepine, urine              Cutoff 200 ng/mL Methadone, urine                   Cutoff 300 ng/mL The urine drug screen provides only a  preliminary, unconfirmed analytical test result and should not be used for non-medical purposes. Clinical consideration and professional judgment should be applied to any positive drug screen result due to possible interfering substances. A more specific alternate chemical method must be used in order to obtain a confirmed analytical result. Gas chromatography / mass spectrometry (GC/MS) is the preferred confirmat                          ory method. Performed at [facility], [address], [place], [place] [address]   . Preg Test, Ur 11/13/2017 NEGATIVE  NEGATIVE Final   Comment:        THE SENSITIVITY OF THIS METHODOLOGY IS >24 mIU/mL   . SURGICAL PATHOLOGY 11/13/2017    Final                   Value:Surgical Pathology CASE: [id number] PATIENT: Bailey Watson Surgical Pathology Report     SPECIMEN SUBMITTED: A. Dermoid cyst  CLINICAL HISTORY: None provided  PRE-OPERATIVE DIAGNOSIS: Mature teratoma left ovary  POST-OPERATIVE DIAGNOSIS: Same as pre op     DIAGNOSIS: A. DERMOID CYST, LEFT OVARY; CYSTECTOMY: - MATURE CYSTIC OVARIAN TERATOMA (DERMOID CYST). - NEGATIVE FOR MALIGNANCY.   GROSS DESCRIPTION: A. Labeled: Dermoid cyst Received: In formalin  Type of procedure: Cystectomy Integrity: Diffusely disrupted Weight of specimen: 20 grams Size of specimen:           Ovary/cyst: 4.5 x 3.2 x 3.1 cm Ovarian surface: Focal shaggy pink-tan area suggestive of external surface Size(s) of mass(es): 4.5 x 3.2 x 3.1 cm  Description of mass(es): Diffusely disrupted cyst and sebaceous and hair material with some areas bony Cyst contents: Sebaceous and hair material Fallopian tube lumen: None identified  Block summary: 1-4 - representative sections                            Final Diagnosis performed by Quay Burow, MD.   Electronically signed 11/17/2017 10:13:05AM The electronic signature indicates that the named Attending Pathologist has evaluated  the specimen  Technical component performed at [facility], [address], [place], [place] [address] Lab: [phone] Dir: Rush Farmer, MD, MMM  Professional component performed at [facility], [facility], [place], [place], [place] [address] Lab: [phone] Dir: Dellia Nims. Reuel Derby, MD     Assessment: 27 y.o. s/p laparoscopic left ovarian cystectomy stable  Plan: Patient has done well after surgery with no apparent complications.  I have discussed the post-operative course to date, and the expected progress moving forward.  The patient understands what complications to be concerned about.  I will see the patient in routine follow up, or sooner if needed.    Activity plan: No heavy lifting.  - Rx Emla cream - Return in about 5 weeks (around 12/26/2017) for postop.   [name], MD, [facility], [facility] 11/22/2017, 9:01 PM

## 2017-11-28 ENCOUNTER — Telehealth: Payer: Self-pay

## 2017-11-28 ENCOUNTER — Encounter: Payer: Self-pay | Admitting: Obstetrics and Gynecology

## 2017-11-28 NOTE — Telephone Encounter (Signed)
Pt is requesting information about a work note, Stating her work is not accommodating to her needs. Please advise

## 2017-11-28 NOTE — Telephone Encounter (Signed)
Pt here, needs note

## 2017-12-04 ENCOUNTER — Telehealth: Payer: Self-pay

## 2017-12-04 NOTE — Telephone Encounter (Signed)
FMLA/DISABILITY form for [employer] filled out, signature obtained, and given to [place] for processing.

## 2017-12-09 ENCOUNTER — Telehealth: Payer: Self-pay

## 2017-12-09 NOTE — Telephone Encounter (Signed)
FMLA/DISABILITY forms (2) filled out for [employer], signature obtained, and given to [place] for processing

## 2017-12-25 ENCOUNTER — Telehealth: Payer: Self-pay

## 2017-12-25 NOTE — Telephone Encounter (Signed)
Pt called triage wanting to have a note releasing her to work. I have advised her we will give her that note on Monday 12/29/17 when she has her last post op visit with staebler to make sure she is cleared to go back to work.

## 2017-12-29 ENCOUNTER — Ambulatory Visit (INDEPENDENT_AMBULATORY_CARE_PROVIDER_SITE_OTHER): Payer: BLUE CROSS/BLUE SHIELD | Admitting: Obstetrics and Gynecology

## 2017-12-29 ENCOUNTER — Encounter: Payer: Self-pay | Admitting: Obstetrics and Gynecology

## 2017-12-29 VITALS — BP 102/60 | HR 78 | Ht 65.0 in | Wt 166.0 lb

## 2017-12-29 DIAGNOSIS — Z9889 Other specified postprocedural states: Secondary | ICD-10-CM

## 2017-12-29 DIAGNOSIS — Z4889 Encounter for other specified surgical aftercare: Secondary | ICD-10-CM

## 2017-12-29 NOTE — Progress Notes (Signed)
Postoperative Follow-up Patient presents post op from laparoscopic left oophorectomy 6weeks ago for left dermoid cyst.  Subjective: Patient reports marked improvement in her preop symptoms. Eating a regular diet without difficulty. The patient is not having any pain.  Activity: normal activities of daily living.  Objective: Blood pressure 102/60, pulse 78, height 5\' 5"  (1.651 m), weight 166 lb (75.3 kg).  Gen: NAD HEENT: normocephalic, anicteric Pulmonary: no increased work of breathing Abdomen: soft, non-tender, non-distended, trocar sites fully healed  Admission on 11/13/2017, Discharged on 11/13/2017  Component Date Value Ref Range Status  . Tricyclic, Ur Screen 81/82/9937 NONE DETECTED  NONE DETECTED Final  . Amphetamines, Ur Screen 11/13/2017 NONE DETECTED  NONE DETECTED Final  . MDMA (Ecstasy)Ur Screen 11/13/2017 NONE DETECTED  NONE DETECTED Final  . Cocaine Metabolite,Ur [place] 11/13/2017 NONE DETECTED  NONE DETECTED Final  . Opiate, Ur Screen 11/13/2017 NONE DETECTED  NONE DETECTED Final  . Phencyclidine (PCP) Ur S 11/13/2017 NONE DETECTED  NONE DETECTED Final  . Cannabinoid 50 Ng, Ur [place] 11/13/2017 POSITIVE* NONE DETECTED Final  . Barbiturates, Ur Screen 11/13/2017 NONE DETECTED  NONE DETECTED Final  . Benzodiazepine, Ur Scrn 11/13/2017 NONE DETECTED  NONE DETECTED Final  . Methadone Scn, Ur 11/13/2017 NONE DETECTED  NONE DETECTED Final   Comment: (NOTE) Tricyclics + metabolites, urine    Cutoff 1000 ng/mL Amphetamines + metabolites, urine  Cutoff 1000 ng/mL MDMA (Ecstasy), urine              Cutoff 500 ng/mL Cocaine Metabolite, urine          Cutoff 300 ng/mL Opiate + metabolites, urine        Cutoff 300 ng/mL Phencyclidine (PCP), urine         Cutoff 25 ng/mL Cannabinoid, urine                 Cutoff 50 ng/mL Barbiturates + metabolites, urine  Cutoff 200 ng/mL Benzodiazepine, urine              Cutoff 200 ng/mL Methadone, urine                   Cutoff 300  ng/mL The urine drug screen provides only a preliminary, unconfirmed analytical test result and should not be used for non-medical purposes. Clinical consideration and professional judgment should be applied to any positive drug screen result due to possible interfering substances. A more specific alternate chemical method must be used in order to obtain a confirmed analytical result. Gas chromatography / mass spectrometry (GC/MS) is the preferred confirmat                          ory method. Performed at [facility], [address], [place], [place] [address]   . Preg Test, Ur 11/13/2017 NEGATIVE  NEGATIVE Final   Comment:        THE SENSITIVITY OF THIS METHODOLOGY IS >24 mIU/mL   . SURGICAL PATHOLOGY 11/13/2017    Final                   Value:Surgical Pathology CASE: [id number] PATIENT: Bailey Watson Surgical Pathology Report     SPECIMEN SUBMITTED: A. Dermoid cyst  CLINICAL HISTORY: None provided  PRE-OPERATIVE DIAGNOSIS: Mature teratoma left ovary  POST-OPERATIVE DIAGNOSIS: Same as pre op     DIAGNOSIS: A. DERMOID CYST, LEFT OVARY; CYSTECTOMY: - MATURE CYSTIC OVARIAN TERATOMA (DERMOID CYST). - NEGATIVE FOR MALIGNANCY.  GROSS DESCRIPTION: A. Labeled: Dermoid cyst Received: In formalin Type of procedure: Cystectomy Integrity: Diffusely disrupted Weight of specimen: 20 grams Size of specimen:           Ovary/cyst: 4.5 x 3.2 x 3.1 cm Ovarian surface: Focal shaggy pink-tan area suggestive of external surface Size(s) of mass(es): 4.5 x 3.2 x 3.1 cm  Description of mass(es): Diffusely disrupted cyst and sebaceous and hair material with some areas bony Cyst contents: Sebaceous and hair material Fallopian tube lumen: None identified  Block summary: 1-4 - representative sections                            Final Diagnosis performed by Quay Burow, MD.   Electronically signed 11/17/2017 10:13:05AM The electronic signature indicates that  the named Attending Pathologist has evaluated the specimen  Technical component performed at [facility], [address] [place], [place] [address] Lab: [phone] Dir: Rush Farmer, MD, MMM  Professional component performed at [facility], [facility], [place], [place], [place] [address] Lab: [phone] Dir: Dellia Nims. Reuel Derby, MD     Assessment: 27 y.o. s/p laparoscopic left ovarian cystectomy stable  Plan: Patient has done well after surgery with no apparent complications.  I have discussed the post-operative course to date, and the expected progress moving forward.  The patient understands what complications to be concerned about.  I will see the patient in routine follow up, or sooner if needed.    Activity plan: No restriction.   [name], MD, [facility], [facility] 12/29/2017, 3:46 PM

## 2018-03-10 ENCOUNTER — Ambulatory Visit (INDEPENDENT_AMBULATORY_CARE_PROVIDER_SITE_OTHER): Payer: BLUE CROSS/BLUE SHIELD | Admitting: Obstetrics and Gynecology

## 2018-03-10 ENCOUNTER — Encounter: Payer: Self-pay | Admitting: Obstetrics and Gynecology

## 2018-03-10 VITALS — BP 98/70 | HR 79 | Ht 65.0 in | Wt 170.0 lb

## 2018-03-10 DIAGNOSIS — Z3049 Encounter for surveillance of other contraceptives: Secondary | ICD-10-CM | POA: Diagnosis not present

## 2018-03-10 DIAGNOSIS — Z3046 Encounter for surveillance of implantable subdermal contraceptive: Secondary | ICD-10-CM

## 2018-03-10 NOTE — Progress Notes (Signed)
   GYNECOLOGY PROCEDURE NOTE  Implanon removal discussed in detail.  Risks of infection, bleeding, nerve injury all reviewed.  Patient understands risks and desires to proceed.  Verbal consent obtained.  Patient is certain she wants the implanon removed.  All questions answered.  Procedure: Patient placed in dorsal supine with left arm above head, elbow flexed at 90 degrees, arm resting on examination table.  Implanon identified without problems.  Betadine scrub x3.  1 ml of 1% lidocaine injected under implanon device without problems.  Sterile gloves applied.  Small 0.5cm incision made at distal tip of implanon device with 11 blade scalpel.  Implanon brought to incision and grasped with a small kelly clamp.  Implanon removed intact without problems.  Pressure applied to incision.  Hemostasis obtained.  Steri-strips applied, followed by bandage and compression dressing.  Patient tolerated procedure well.  No complications.   Assessment: 27 y.o. year old female now s/p uncomplicated implanon removal.  Plan: 1.  Patient given post procedure precautions and asked to call for fever, chills, redness or drainage from her incision, bleeding from incision.  She understands she will likely have a small bruise near site of removal and can remove bandage tomorrow and steri-strips in approximately 1 week.  2) Contraception - declines  J2001 for lidocaine block, [id number] for nexplanon removal

## 2019-05-14 ENCOUNTER — Other Ambulatory Visit: Payer: Self-pay

## 2019-05-14 ENCOUNTER — Other Ambulatory Visit (HOSPITAL_COMMUNITY)
Admission: RE | Admit: 2019-05-14 | Discharge: 2019-05-14 | Disposition: A | Discharge status: Home / Self Care | Payer: Medicaid Other | Source: Ambulatory Visit | Attending: Obstetrics and Gynecology | Admitting: Obstetrics and Gynecology

## 2019-05-14 ENCOUNTER — Ambulatory Visit (INDEPENDENT_AMBULATORY_CARE_PROVIDER_SITE_OTHER): Payer: Medicaid Other | Admitting: Obstetrics and Gynecology

## 2019-05-14 ENCOUNTER — Encounter: Payer: Self-pay | Admitting: Obstetrics and Gynecology

## 2019-05-14 VITALS — BP 108/70 | HR 67 | Ht 65.0 in | Wt 173.0 lb

## 2019-05-14 DIAGNOSIS — Z124 Encounter for screening for malignant neoplasm of cervix: Secondary | ICD-10-CM

## 2019-05-14 DIAGNOSIS — Z0000 Encounter for general adult medical examination without abnormal findings: Secondary | ICD-10-CM

## 2019-05-14 DIAGNOSIS — Z01419 Encounter for gynecological examination (general) (routine) without abnormal findings: Secondary | ICD-10-CM | POA: Insufficient documentation

## 2019-05-14 DIAGNOSIS — R1033 Periumbilical pain: Secondary | ICD-10-CM

## 2019-05-14 DIAGNOSIS — Z113 Encounter for screening for infections with a predominantly sexual mode of transmission: Secondary | ICD-10-CM

## 2019-05-14 DIAGNOSIS — K429 Umbilical hernia without obstruction or gangrene: Secondary | ICD-10-CM

## 2019-05-14 NOTE — Addendum Note (Signed)
Addended by: Dorthula Nettles on: 05/14/2019 01:17 PM   Modules accepted: Orders

## 2019-05-14 NOTE — Progress Notes (Signed)
Gynecology Annual Exam   PCP: Patient, No Pcp Per  Chief Complaint:  Chief Complaint  Patient presents with  . Gynecologic Exam    Pain in bellybutton, swollen. Pain on left side,possible cyst    History of Present Illness: Patient is a 29 y.o. G1P1001 presents for annual exam. The patient has no complaints today.   LMP: Patient's last menstrual period was 05/10/2019. Average Interval: regular, 28 days Duration of flow: 4 days Heavy Menses: no Clots: no Intermenstrual Bleeding: no Postcoital Bleeding: no Dysmenorrhea: no  The patient is sexually active. She currently uses condoms for contraception. Was on nexplanon up till 03/10/2018.  She denies dyspareunia. There is no notable family history of breast or ovarian cancer in her family.  The patient wears seatbelts: yes.   The patient has regular exercise: not asked.    The patient denies current symptoms of depression.  She has noted some pain and discomfort occasional at the umbilicus and umbilical trocar site from her 2019 laparoscopy.    Review of Systems: Review of Systems  Constitutional: Negative for chills and fever.  HENT: Negative for congestion.   Respiratory: Negative for cough and shortness of breath.   Cardiovascular: Negative for chest pain and palpitations.  Gastrointestinal: Negative for abdominal pain, constipation, diarrhea, heartburn, nausea and vomiting.  Genitourinary: Negative for dysuria, frequency and urgency.  Skin: Negative for itching and rash.  Neurological: Negative for dizziness and headaches.  Endo/Heme/Allergies: Negative for polydipsia.  Psychiatric/Behavioral: Negative for depression.    Past Medical History:  Past Medical History:  Diagnosis Date  . BMI 40.0-44.9, adult ([place])   . Dermoid cyst of left ovary   . Herpes labialis   . Obesity     Past Surgical History:  Past Surgical History:  Procedure Laterality Date  . CESAREAN SECTION N/A 11/21/2015   Procedure: CESAREAN  SECTION;  Surgeon: Will Bonnet, MD;  Location: [facility] ORS;  Service: Obstetrics;  Laterality: N/A;  . LAPAROSCOPIC OVARIAN CYSTECTOMY Left 11/13/2017   Procedure: LAPAROSCOPIC OVARIAN CYSTECTOMY;  Surgeon: [name], MD;  Location: [facility] ORS;  Service: Gynecology;  Laterality: Left;  . NO PAST SURGERIES      Gynecologic History:  Patient's last menstrual period was 05/10/2019. Contraception: condoms  Obstetric History: G1P1001  Family History:  Family History  Problem Relation Age of Onset  . Diabetes Paternal Grandfather     Social History:  Social History   Socioeconomic History  . Marital status: Single    Spouse name: Not on file  . Number of children: Not on file  . Years of education: Not on file  . Highest education level: Not on file  Occupational History  . Not on file  Tobacco Use  . Smoking status: Never Smoker  . Smokeless tobacco: Never Used  Substance and Sexual Activity  . Alcohol use: Yes    Comment: occasional  . Drug use: Yes    Frequency: 7.0 times per week    Types: Marijuana  . Sexual activity: Yes    Birth control/protection: None  Other Topics Concern  . Not on file  Social History Narrative  . Not on file   Social Determinants of Health   Financial Resource Strain:   . Difficulty of Paying Living Expenses: Not on file  Food Insecurity:   . Worried About Charity fundraiser in the Last Year: Not on file  . Ran Out of Food in the Last Year: Not on file  Transportation Needs:   .  Lack of Transportation (Medical): Not on file  . Lack of Transportation (Non-Medical): Not on file  Physical Activity:   . Days of Exercise per Week: Not on file  . Minutes of Exercise per Session: Not on file  Stress:   . Feeling of Stress : Not on file  Social Connections:   . Frequency of Communication with Friends and Family: Not on file  . Frequency of Social Gatherings with Friends and Family: Not on file  . Attends Religious Services: Not on  file  . Active Member of Clubs or Organizations: Not on file  . Attends Archivist Meetings: Not on file  . Marital Status: Not on file  Intimate Partner Violence:   . Fear of Current or Ex-Partner: Not on file  . Emotionally Abused: Not on file  . Physically Abused: Not on file  . Sexually Abused: Not on file    Allergies:  No Known Allergies  Medications: Prior to Admission medications   Not on File    Physical Exam Vitals: Blood pressure 108/70, pulse 67, height 5\' 5"  (1.651 m), weight 173 lb (78.5 kg), last menstrual period 05/10/2019.  General: NAD HEENT: normocephalic, anicteric Thyroid: no enlargement, no palpable nodules Pulmonary: No increased work of breathing, CTAB Cardiovascular: RRR, distal pulses 2+ Abdomen: NABS, soft, non-tender, non-distended.  Umbilicus without lesions.  No hepatomegaly, splenomegaly or masses palpable. Potential small umbilical hernia  Genitourinary:  External: Normal external female genitalia.  Normal urethral meatus, normal Bartholin's and Skene's glands.    Vagina: Normal vaginal mucosa, no evidence of prolapse.    Cervix: Grossly normal in appearance, no bleeding  Uterus: Non-enlarged, mobile, normal contour.  No CMT  Adnexa: ovaries non-enlarged, no adnexal masses  Rectal: deferred  Lymphatic: no evidence of inguinal lymphadenopathy Extremities: no edema, erythema, or tenderness Neurologic: Grossly intact Psychiatric: mood appropriate, affect full  Female chaperone present for pelvic and breast  portions of the physical exam    Assessment: 29 y.o. G1P1001 routine annual exam  Plan: Problem List Items Addressed This Visit    None    Visit Diagnoses    Encounter for gynecological examination without abnormal finding    -  Primary   Relevant Orders   HEP, RPR, HIV Panel   Screening for malignant neoplasm of cervix       Relevant Orders   Cytology - PAP   Routine screening for STI (sexually transmitted infection)        Relevant Orders   Cytology - PAP   Umbilical hernia without obstruction and without gangrene       Relevant Orders   US Abdomen Complete   Periumbilical abdominal pain       Relevant Orders   US Abdomen Complete      2) STI screening  wasoffered and accepted  2)  ASCCP guidelines and rational discussed.  Patient opts for every 3 years screening interval  3) Contraception - the patient is currently using  condoms.  She is happy with her current form of contraception and plans to continue  4) Routine healthcare maintenance including cholesterol, diabetes screening discussed managed by PCP  5) Trocar site discomfort - 2 years postop, mild defect palpated infraumbilical, normal exam LLQ trocar.  Will start with [place] to evaluate for hernia.  6) Return in about 1 year (around 05/13/2020) for annual.   [name], MD, [facility], [facility] 05/14/2019, 9:38 AM

## 2019-05-15 LAB — HEP, RPR, HIV PANEL
HIV Screen 4th Generation wRfx: NONREACTIVE
Hepatitis B Surface Ag: NEGATIVE
RPR Ser Ql: NONREACTIVE

## 2019-05-17 LAB — CYTOLOGY - PAP
Chlamydia: NEGATIVE
Comment: NEGATIVE
Comment: NORMAL
Diagnosis: NEGATIVE
Neisseria Gonorrhea: NEGATIVE

## 2019-05-19 ENCOUNTER — Ambulatory Visit: Payer: Self-pay | Attending: Obstetrics and Gynecology

## 2019-11-01 ENCOUNTER — Other Ambulatory Visit: Payer: Self-pay

## 2019-11-01 ENCOUNTER — Encounter: Payer: Self-pay | Admitting: Obstetrics and Gynecology

## 2019-11-01 ENCOUNTER — Ambulatory Visit (INDEPENDENT_AMBULATORY_CARE_PROVIDER_SITE_OTHER): Payer: Medicaid Other | Admitting: Obstetrics and Gynecology

## 2019-11-01 VITALS — BP 126/76 | Ht 65.0 in | Wt 174.0 lb

## 2019-11-01 DIAGNOSIS — Z113 Encounter for screening for infections with a predominantly sexual mode of transmission: Secondary | ICD-10-CM

## 2019-11-01 DIAGNOSIS — R1904 Left lower quadrant abdominal swelling, mass and lump: Secondary | ICD-10-CM

## 2019-11-01 DIAGNOSIS — R102 Pelvic and perineal pain: Secondary | ICD-10-CM

## 2019-11-01 DIAGNOSIS — N761 Subacute and chronic vaginitis: Secondary | ICD-10-CM

## 2019-11-01 NOTE — Progress Notes (Signed)
Obstetrics & Gynecology Office Visit   Chief Complaint:  Chief Complaint  Patient presents with  . cramping on left side    bulging on left side since surgery  . Gynecologic Exam    Requesting STD testing    History of Present Illness: 29 y.o. G1P1001 presenting with left lower quadrant tender mass palpated by patient a few week ago and located in the left lower quadrant anterior abdominal wall just superior to prior cesarean scar.  The area is about the size of a marble.  It has remained relatively stable in size over the last few weeks.  She does feel it becomes more tender around the time of her menstrual cycles.  There are no overlying skin changes.   Patient is a 29 y.o. G1P1001 presents for STD testing.  The patient has not noted intermenstrual spotting,  has not experienced postcoital bleeding, and does not report increased vaginal discharge.  T  The patient is sexually active. She currently uses Condoms for contraception. The patient has been previously transfused or tattooed.    Review of Systems:  Review of Systems  Constitutional: Negative.   Gastrointestinal: Positive for abdominal pain.  Genitourinary: Negative.   Skin: Negative.      Past Medical History:  Past Medical History:  Diagnosis Date  . BMI 40.0-44.9, adult ([place])   . Dermoid cyst of left ovary   . Herpes labialis   . Obesity     Past Surgical History:  Past Surgical History:  Procedure Laterality Date  . CESAREAN SECTION N/A 11/21/2015   Procedure: CESAREAN SECTION;  Surgeon: Will Bonnet, MD;  Location: [facility] ORS;  Service: Obstetrics;  Laterality: N/A;  . LAPAROSCOPIC OVARIAN CYSTECTOMY Left 11/13/2017   Procedure: LAPAROSCOPIC OVARIAN CYSTECTOMY;  Surgeon: [name], MD;  Location: [facility] ORS;  Service: Gynecology;  Laterality: Left;  . NO PAST SURGERIES      Gynecologic History: Patient's last menstrual period was 10/27/2019.  Obstetric History: G1P1001  Family History:    Family History  Problem Relation Age of Onset  . Diabetes Paternal Grandfather     Social History:  Social History   Socioeconomic History  . Marital status: Single    Spouse name: Not on file  . Number of children: Not on file  . Years of education: Not on file  . Highest education level: Not on file  Occupational History  . Not on file  Tobacco Use  . Smoking status: Never Smoker  . Smokeless tobacco: Never Used  Vaping Use  . Vaping Use: Former  . Start date: 03/22/2017  Substance and Sexual Activity  . Alcohol use: Yes    Comment: occasional  . Drug use: Yes    Frequency: 7.0 times per week    Types: Marijuana  . Sexual activity: Yes    Birth control/protection: None  Other Topics Concern  . Not on file  Social History Narrative  . Not on file   Social Determinants of Health   Financial Resource Strain:   . Difficulty of Paying Living Expenses:   Food Insecurity:   . Worried About Charity fundraiser in the Last Year:   . Arboriculturist in the Last Year:   Transportation Needs:   . Film/video editor (Medical):   Marland Kitchen Lack of Transportation (Non-Medical):   Physical Activity:   . Days of Exercise per Week:   . Minutes of Exercise per Session:   Stress:   . Feeling  of Stress :   Social Connections:   . Frequency of Communication with Friends and Family:   . Frequency of Social Gatherings with Friends and Family:   . Attends Religious Services:   . Active Member of Clubs or Organizations:   . Attends Archivist Meetings:   Marland Kitchen Marital Status:   Intimate Partner Violence:   . Fear of Current or Ex-Partner:   . Emotionally Abused:   Marland Kitchen Physically Abused:   . Sexually Abused:     Allergies:  No Known Allergies  Medications: Prior to Admission medications   Not on File    Physical Exam Vitals:  Vitals:   11/01/19 1431  BP: 126/76   Patient's last menstrual period was 10/27/2019.  General: NAD HEENT: normocephalic,  anicteric Thyroid: no enlargement, no palpable nodules Pulmonary: No increased work of breathing Cardiovascular: RRR, distal pulses 2+ Abdomen: NABS, soft, non-tender, non-distended.  Umbilicus without lesions.  No hepatomegaly, splenomegaly. No evidence of hernia.  There is a palpable firm, mildly tender 1cm mass approximately 2cm above the left aspect of the prior cesarean incision.  The area is well circumscribed and mobile.  It does not increase in size with straining Genitourinary:  External: Normal external female genitalia.  Normal urethral meatus, normal Bartholin's and Skene's glands.    Vagina: Normal vaginal mucosa, no evidence of prolapse.     Rectal: deferred  Lymphatic: no evidence of inguinal lymphadenopathy Extremities: no edema, erythema, or tenderness Neurologic: Grossly intact Psychiatric: mood appropriate, affect full  Female chaperone present for pelvic  portions of the physical exam  Physical Exam Abdominal:        Assessment: 29 y.o. G1P1001 with pelvic pain  Plan: Problem List Items Addressed This Visit    None    Visit Diagnoses    Pelvic pain in female    -  Primary   Relevant Orders   CT ABDOMEN PELVIS W CONTRAST   Chronic vaginitis       Relevant Orders   NuSwab Vaginitis Plus (VG+)   Routine screening for STI (sexually transmitted infection)       Relevant Orders   NuSwab Vaginitis Plus (VG+)   Left lower quadrant abdominal wall mass       Relevant Orders   CT ABDOMEN PELVIS W CONTRAST     1)left lower quadrant abdominal mass.  Approximately marble size, well cirumscribed, mobile, mildly tender near site of prior C-section scar - will obtain CT A/P - DDx includes endometriosis in cesarean scar, lipoma, or other neoplasm  2) STI screening requested - obtained GC/CT declines additional  blood work  3) Return if symptoms worsen or fail to improve, will call with CT scan results.   [name], MD, [facility], [facility] 11/01/2019, 2:37 PM

## 2019-11-03 LAB — NUSWAB VAGINITIS PLUS (VG+)
Atopobium vaginae: HIGH Score — AB
Candida albicans, NAA: NEGATIVE
Candida glabrata, NAA: NEGATIVE
Chlamydia trachomatis, NAA: NEGATIVE
Neisseria gonorrhoeae, NAA: NEGATIVE
Trich vag by NAA: POSITIVE — AB

## 2019-11-10 ENCOUNTER — Other Ambulatory Visit: Payer: Self-pay | Admitting: Obstetrics and Gynecology

## 2019-11-10 MED ORDER — METRONIDAZOLE 500 MG PO TABS: 2000.0000 mg | ORAL_TABLET | Freq: Once | ORAL | 0 refills | Status: AC

## 2019-11-10 NOTE — Progress Notes (Signed)
Informed patient of positive trichomonas, Rx flagyl called in.  Discussed need for partner treatment.

## 2019-12-09 ENCOUNTER — Ambulatory Visit: Payer: Self-pay | Admitting: Obstetrics and Gynecology

## 2019-12-09 ENCOUNTER — Other Ambulatory Visit: Payer: Medicaid Other

## 2019-12-09 ENCOUNTER — Ambulatory Visit: Payer: Medicaid Other | Admitting: Obstetrics and Gynecology

## 2019-12-10 ENCOUNTER — Ambulatory Visit
Admission: RE | Admit: 2019-12-10 | Discharge: 2019-12-10 | Disposition: A | Discharge status: Home / Self Care | Payer: Self-pay | Source: Ambulatory Visit | Attending: Obstetrics and Gynecology | Admitting: Obstetrics and Gynecology

## 2019-12-10 ENCOUNTER — Other Ambulatory Visit: Payer: Self-pay

## 2019-12-10 DIAGNOSIS — R1904 Left lower quadrant abdominal swelling, mass and lump: Secondary | ICD-10-CM | POA: Insufficient documentation

## 2019-12-10 DIAGNOSIS — R102 Pelvic and perineal pain: Secondary | ICD-10-CM | POA: Insufficient documentation

## 2019-12-10 MED ORDER — IOHEXOL 300 MG/ML  SOLN
100.0000 mL | Freq: Once | INTRAMUSCULAR | Status: AC | PRN
  Administered 2019-12-10: 100 mL via INTRAVENOUS

## 2019-12-21 ENCOUNTER — Other Ambulatory Visit: Payer: Self-pay | Admitting: Obstetrics and Gynecology

## 2019-12-21 DIAGNOSIS — N806 Endometriosis in cutaneous scar: Secondary | ICD-10-CM

## 2019-12-21 NOTE — Progress Notes (Signed)
CT scan results reviewed with patient.  Appears to have endometriosis of the anterior abdominal wall at site of pfannenstiel.  Will refer to General Surgery for resection.

## 2019-12-23 ENCOUNTER — Other Ambulatory Visit: Payer: Self-pay

## 2019-12-23 ENCOUNTER — Ambulatory Visit (INDEPENDENT_AMBULATORY_CARE_PROVIDER_SITE_OTHER): Payer: Self-pay | Admitting: Surgery

## 2019-12-23 ENCOUNTER — Encounter: Payer: Self-pay | Admitting: Surgery

## 2019-12-23 ENCOUNTER — Ambulatory Visit: Payer: Self-pay | Admitting: Surgery

## 2019-12-23 VITALS — Temp 98.6°F | Ht 65.0 in | Wt 179.0 lb

## 2019-12-23 DIAGNOSIS — N806 Endometriosis in cutaneous scar: Secondary | ICD-10-CM

## 2019-12-23 NOTE — Progress Notes (Signed)
Patient ID: Bailey Watson, female   DOB: 05-May-1990, 29 y.o.   MRN: [id number]  Chief Complaint: Left lower quadrant subcutaneous mass, history of cesarean section.  History of Present Illness Bailey Watson is a 29 y.o. female with history of C-section about 4 years ago.  About the same duration she is noted a mass in the left lower quadrant at the limit of her Pfannenstiel scar.  She notes the discomfort that comes with it seems to flareup with menstrual cramping.  Apparently had it increased in size about 3 months ago and made her more concerned.  She has a CT scan which confirms the presence of a cystic lesion down in the left lower quadrant at that site.  I suspect she has an endometrial implant from her cesarean section.  Past Medical History Past Medical History:  Diagnosis Date  . Dermoid cyst of left ovary   . Herpes labialis   . Obesity       Past Surgical History:  Procedure Laterality Date  . CESAREAN SECTION N/A 11/21/2015   Procedure: CESAREAN SECTION;  Surgeon: Will Bonnet, MD;  Location: [facility] ORS;  Service: Obstetrics;  Laterality: N/A;  . LAPAROSCOPIC OVARIAN CYSTECTOMY Left 11/13/2017   Procedure: LAPAROSCOPIC OVARIAN CYSTECTOMY;  Surgeon: [name], MD;  Location: [facility] ORS;  Service: Gynecology;  Laterality: Left;  . NO PAST SURGERIES      No Known Allergies  No current outpatient medications on file.   No current facility-administered medications for this visit.    Family History Family History  Problem Relation Age of Onset  . Diabetes Paternal Grandfather       Social History Social History   Tobacco Use  . Smoking status: Never Smoker  . Smokeless tobacco: Never Used  Vaping Use  . Vaping Use: Former  . Start date: 03/22/2017  Substance Use Topics  . Alcohol use: Yes    Comment: occasional  . Drug use: Yes    Frequency: 7.0 times per week    Types: Marijuana        Review of Systems  Constitutional: Negative.   HENT: Negative.    Eyes: Negative.   Respiratory: Negative.   Cardiovascular: Negative.   Gastrointestinal: Negative.   Genitourinary: Negative.   Skin: Negative.   Neurological: Negative.   Psychiatric/Behavioral: Negative.       Physical Exam Temperature 98.6 F (37 C), height 5\' 5"  (1.651 m), weight 179 lb (81.2 kg), last menstrual period 12/09/2019, SpO2 98 %. Last Weight  Most recent update: 12/23/2019 11:19 AM   Weight  81.2 kg (179 lb)            CONSTITUTIONAL: Well developed, and nourished, appropriately responsive and aware without distress.   EYES: Sclera non-icteric.   EARS, NOSE, MOUTH AND THROAT: Mask worn.   Hearing is intact to voice.  NECK: Trachea is midline, and there is no jugular venous distension.  LYMPH NODES:  Lymph nodes in the neck are not enlarged. RESPIRATORY:  Lungs are clear, and breath sounds are equal bilaterally. Normal respiratory effort without pathologic use of accessory muscles. CARDIOVASCULAR: Heart is regular in rate and rhythm. GI: The abdomen is soft, nontender, and nondistended. There is a 2 cm palpable mass low in the left lower quadrant at the lateral limit of the difficult to appreciate Pfannenstiel scar.  I do not appreciate any associated hernia.  I did not appreciate hepatosplenomegaly. There were normal bowel sounds. GU:  MUSCULOSKELETAL:  Symmetrical muscle tone appreciated  in all four extremities.    SKIN: Skin turgor is normal. No pathologic skin lesions appreciated.  NEUROLOGIC:  Motor and sensation appear grossly normal.  Cranial nerves are grossly without defect. PSYCH:  Alert and oriented to person, place and time. Affect is appropriate for situation.  Data Reviewed I have personally reviewed what is currently available of the patient's imaging, recent labs and medical records.   Labs:  CBC Latest Ref Rng & Units 11/22/2015 11/21/2015  WBC 3.6 - 11.0 K/uL 19.8(H) 17.4(H)  Hemoglobin 12.0 - 16.0 g/dL 11.2(L) 12.7  Hematocrit 35 - 47 % 32.4(L)  36.2  Platelets 150 - 440 K/uL 148(L) 190   CMP Latest Ref Rng & Units 11/21/2015  Glucose 65 - 99 mg/dL 82  BUN 6 - 20 mg/dL 9  Creatinine 0.44 - 1.00 mg/dL 0.58  Sodium 135 - 145 mmol/L 134(L)  Potassium 3.5 - 5.1 mmol/L 4.1  Chloride 101 - 111 mmol/L 104  CO2 22 - 32 mmol/L 20(L)  Calcium 8.9 - 10.3 mg/dL 9.8  Total Protein 6.5 - 8.1 g/dL 7.4  Total Bilirubin 0.3 - 1.2 mg/dL 0.7  Alkaline Phos 38 - 126 U/L 173(H)  AST 15 - 41 U/L 25  ALT 14 - 54 U/L 16      Imaging: Radiology review: I reviewed her recent CT scan examination, showing her the images which are consistent with a likely endometrial implant at the lateral limit of her scar. Within last 24 hrs: No results found.  Assessment    Subcutaneous mass left lower quadrant abdominal wall, likely endometrial implant. Patient Active Problem List   Diagnosis Date Noted  . 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  Risks discussed in detail which include but are not limited to anesthesia, bleeding, infection, recurrence.  I believe she understands these are not all inclusive and desires to proceed.  Questions answered, no guarantees were ever expressed or implied.  Face-to-face time spent with the patient and accompanying care providers(if present) was 20 minutes, with more than 50% of the time spent counseling, educating, and coordinating care of the patient.      Ronny Bacon M.D., FACS 12/23/2019, 3:11 PM

## 2019-12-23 NOTE — Patient Instructions (Signed)
We have spoken about having surgery. This will be done at [facility] with Dr. Christian Mate.  You will most likely be out of work 1-2 days for this surgery.      Please see the (blue)pre-care form that you have been given today. If you have any questions, please call our office. Our surgery scheduler will be in contact with you to go over surgery dates and information.

## 2019-12-24 ENCOUNTER — Telehealth: Payer: Self-pay | Admitting: Surgery

## 2019-12-24 NOTE — Telephone Encounter (Signed)
Pt has been advised of Pre-Admission date/time, COVID Testing date and Surgery date.  Surgery Date: 01/14/20 Preadmission Testing Date: 01/07/20 (phone 8a-1p) Covid Testing Date: 01/12/20 - patient advised to go to the [facility] ([place]) between 8a-1p   Patient has been made aware to call [phone], between 1-3:00pm the day before surgery, to find out what time to arrive for surgery.

## 2019-12-28 ENCOUNTER — Other Ambulatory Visit: Admission: RE | Admit: 2019-12-28 | Payer: PRIVATE HEALTH INSURANCE | Source: Ambulatory Visit

## 2019-12-31 ENCOUNTER — Ambulatory Visit: Payer: Medicaid Other | Admitting: Obstetrics and Gynecology

## 2020-01-03 ENCOUNTER — Other Ambulatory Visit: Payer: PRIVATE HEALTH INSURANCE

## 2020-01-07 ENCOUNTER — Inpatient Hospital Stay: Admission: RE | Admit: 2020-01-07 | Payer: PRIVATE HEALTH INSURANCE | Source: Ambulatory Visit

## 2020-01-07 ENCOUNTER — Telehealth: Payer: Self-pay

## 2020-01-07 NOTE — Telephone Encounter (Signed)
Needs to be seen because in the setting of STI we need to also check for gonorrhea and chlamdyai

## 2020-01-07 NOTE — Telephone Encounter (Signed)
Pt calling; needs information about rx.  [phone]  Pt states she has been in contact c someone dx'd c trich and wants a refill of the medication she was given for it as she knows she has it too.  Adv will ask AMS but he may want to see her. Pharm correct.

## 2020-01-10 ENCOUNTER — Other Ambulatory Visit: Payer: Self-pay

## 2020-01-10 ENCOUNTER — Other Ambulatory Visit (HOSPITAL_COMMUNITY)
Admission: RE | Admit: 2020-01-10 | Discharge: 2020-01-10 | Disposition: A | Discharge status: Home / Self Care | Payer: Medicaid Other | Source: Ambulatory Visit | Attending: Obstetrics and Gynecology | Admitting: Obstetrics and Gynecology

## 2020-01-10 ENCOUNTER — Ambulatory Visit (INDEPENDENT_AMBULATORY_CARE_PROVIDER_SITE_OTHER): Payer: Medicaid Other | Admitting: Obstetrics and Gynecology

## 2020-01-10 ENCOUNTER — Encounter: Payer: Self-pay | Admitting: Obstetrics and Gynecology

## 2020-01-10 VITALS — BP 128/66 | HR 79 | Ht 65.0 in | Wt 181.0 lb

## 2020-01-10 DIAGNOSIS — Z113 Encounter for screening for infections with a predominantly sexual mode of transmission: Secondary | ICD-10-CM | POA: Insufficient documentation

## 2020-01-10 NOTE — Progress Notes (Signed)
Gynecology STD Evaluation   hChief Complaint:  Chief Complaint  Patient presents with  . STD testing    Exposure    History of Present Illness: Patient is a 29 y.o. G1P1001 presents for STD testing.  The patient has not noted intermenstrual spotting,  has not experienced postcoital bleeding, and does not report increased vaginal discharge.  There is a history of prior sexually transmitted infection(s).  Recently diagnosed and treated for trichomonas vaginalis.      The patient is sexually active. She currently uses Condoms for contraception. She sometimes uses condoms or barrier methods to prevent the spread of sexually transmitted infections.      Review of Systems: 10 point review of systems negative unless otherwise noted in HPI  Past Medical History:  Past Medical History:  Diagnosis Date  . Dermoid cyst of left ovary   . Herpes labialis   . Obesity     Past Surgical History:  Past Surgical History:  Procedure Laterality Date  . CESAREAN SECTION N/A 11/21/2015   Procedure: CESAREAN SECTION;  Surgeon: Will Bonnet, MD;  Location: [facility] ORS;  Service: Obstetrics;  Laterality: N/A;  . LAPAROSCOPIC OVARIAN CYSTECTOMY Left 11/13/2017   Procedure: LAPAROSCOPIC OVARIAN CYSTECTOMY;  Surgeon: [name], MD;  Location: [facility] ORS;  Service: Gynecology;  Laterality: Left;  . NO PAST SURGERIES      Gynecologic History: Patient's last menstrual period was 01/06/2020.  Obstetric History: G1P1001  Family History:  Family History  Problem Relation Age of Onset  . Diabetes Paternal Grandfather     Social History:  Social History   Socioeconomic History  . Marital status: Single    Spouse name: Not on file  . Number of children: Not on file  . Years of education: Not on file  . Highest education level: Not on file  Occupational History  . Not on file  Tobacco Use  . Smoking status: Never Smoker  . Smokeless tobacco: Never Used  Vaping Use  . Vaping Use: Former    . Start date: 03/22/2017  Substance and Sexual Activity  . Alcohol use: Yes    Comment: occasional  . Drug use: Yes    Frequency: 7.0 times per week    Types: Marijuana  . Sexual activity: Yes    Birth control/protection: None  Other Topics Concern  . Not on file  Social History Narrative  . Not on file   Social Determinants of Health   Financial Resource Strain:   . Difficulty of Paying Living Expenses: Not on file  Food Insecurity:   . Worried About Charity fundraiser in the Last Year: Not on file  . Ran Out of Food in the Last Year: Not on file  Transportation Needs:   . Lack of Transportation (Medical): Not on file  . Lack of Transportation (Non-Medical): Not on file  Physical Activity:   . Days of Exercise per Week: Not on file  . Minutes of Exercise per Session: Not on file  Stress:   . Feeling of Stress : Not on file  Social Connections:   . Frequency of Communication with Friends and Family: Not on file  . Frequency of Social Gatherings with Friends and Family: Not on file  . Attends Religious Services: Not on file  . Active Member of Clubs or Organizations: Not on file  . Attends Archivist Meetings: Not on file  . Marital Status: Not on file  Intimate Partner Violence:   . Fear  of Current or Ex-Partner: Not on file  . Emotionally Abused: Not on file  . Physically Abused: Not on file  . Sexually Abused: Not on file    Allergies:  No Known Allergies  Medications: Prior to Admission medications   Not on File    Physical Exam Blood pressure 128/66, pulse 79, height 5\' 5"  (1.651 m), weight 181 lb (82.1 kg), last menstrual period 01/06/2020. Patient's last menstrual period was 01/06/2020.  General: NAD HEENT: normocephalic, anicteric Thyroid: no enlargement, no palpable nodules Pulmonary: No increased work of breathing, CTAB Cardiovascular: RRR, distal pulses 2+ Breast: Breast symmetrical, no tenderness, no palpable nodules or masses, no  skin or nipple retraction present, no nipple discharge.  No axillary or supraclavicular lymphadenopathy. Abdomen: NABS, soft, non-tender, non-distended.  Umbilicus without lesions.  No hepatomegaly, splenomegaly or masses palpable. No evidence of hernia  Genitourinary:  External: Normal external female genitalia.  Normal urethral meatus, normal  Bartholin's and Skene's glands.    Vagina: Normal vaginal mucosa, no evidence of prolapse.    Cervix: Grossly normal in appearance, no bleeding  Uterus: Non-enlarged, mobile, normal contour.  No CMT  Adnexa: ovaries non-enlarged, no adnexal masses  Rectal: deferred  Lymphatic: no evidence of inguinal lymphadenopathy Extremities: no edema, erythema, or tenderness Neurologic: Grossly intact Psychiatric: mood appropriate, affect full  Female chaperone present for pelvic and breast  portions of the physical exam  Assessment: 29 y.o. G1P1001 presenting for STD screening Plan: Problem List Items Addressed This Visit    None    Visit Diagnoses    Routine screening for STI (sexually transmitted infection)    -  Primary   Relevant Orders   Cervicovaginal ancillary only       1) Contraception - Education given regarding options for contraception, including barrier methods.  We discussed that contraception, while reliable in preventing unintended pregnancy does not mitigate her risk of STI.  We discussed safe sex practices to reduce her furture risk of STI's.    2) Full STI screening was offered declined, repeat aptima today  3) Pap smear up to date  4) Return in about 1 year (around 01/09/2021) for annual.   [name], MD, [place] [employer] 01/10/2020, 2:37 PM

## 2020-01-12 ENCOUNTER — Telehealth: Payer: Self-pay | Admitting: Surgery

## 2020-01-12 ENCOUNTER — Inpatient Hospital Stay: Admission: RE | Admit: 2020-01-12 | Payer: PRIVATE HEALTH INSURANCE | Source: Ambulatory Visit

## 2020-01-12 NOTE — Telephone Encounter (Signed)
Patient calls to cancel surgery again.  This time says she wants to wait until the end of the year.  She is informed that she will need to see Dr. Christian Mate for follow up first prior to rescheduling surgery.  Her follow up is made for 04/04/20 and will discuss possible rescheduling of surgery then.

## 2020-01-12 NOTE — Telephone Encounter (Signed)
Pt saw AMS 01/10/20.

## 2020-01-12 NOTE — OR Nursing (Signed)
Contacted patient via phone to remind her of her covid test today for preop. She states that she is going to reschedule her procedure but has not told the physician. I asked her to make contact with that office.  I did call Dr Christian Mate and notified them of her decision.  They will reach out to her.

## 2020-01-13 LAB — CERVICOVAGINAL ANCILLARY ONLY
Chlamydia: NEGATIVE
Comment: NEGATIVE
Comment: NEGATIVE
Comment: NORMAL
Neisseria Gonorrhea: NEGATIVE
Trichomonas: POSITIVE — AB

## 2020-01-14 ENCOUNTER — Encounter: Admission: RE | Payer: Self-pay | Source: Home / Self Care

## 2020-01-14 ENCOUNTER — Ambulatory Visit: Admission: RE | Admit: 2020-01-14 | Payer: PRIVATE HEALTH INSURANCE | Source: Home / Self Care | Admitting: Surgery

## 2020-01-14 SURGERY — EXCISION, MASS, TORSO
Anesthesia: General | Laterality: Left

## 2020-01-19 ENCOUNTER — Other Ambulatory Visit: Payer: Self-pay | Admitting: Obstetrics and Gynecology

## 2020-01-19 MED ORDER — METRONIDAZOLE 500 MG PO TABS: 2000.0000 mg | ORAL_TABLET | Freq: Once | ORAL | 0 refills | Status: AC

## 2020-04-04 ENCOUNTER — Ambulatory Visit: Payer: Medicaid Other | Admitting: Surgery

## 2021-11-18 IMAGING — CT CT ABD-PELV W/ CM
2 of 4 series · 17 of 46 positions shown, 19 images · IV contrast (omnipaque)
Comparison: None.

CLINICAL DATA: Lump along left side of C-section scar.

EXAM:
CT ABDOMEN AND PELVIS WITH CONTRAST
TECHNIQUE: Multidetector CT imaging of the abdomen and pelvis was performed
using the standard protocol following bolus administration of
intravenous contrast.
CONTRAST:  100mL OMNIPAQUE IOHEXOL 300 MG/ML  SOLN

[Series 2: abd pelvis 5.00 · axial · 0.77mm/px · z∈[-1482,-1087]mm · 14 of 87 slices shown, 16 images]
[im 4/87  soft-tissue]
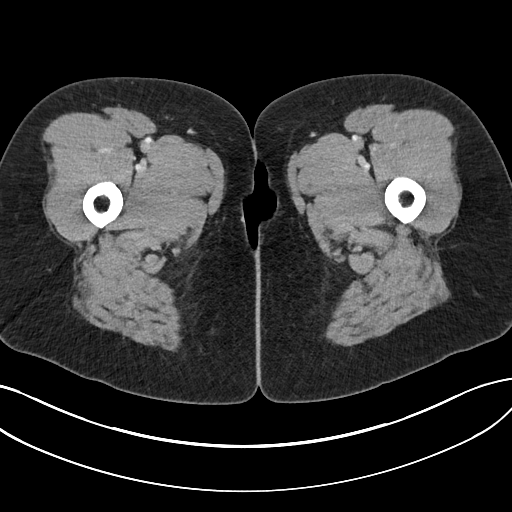
[im 4/87  bone]
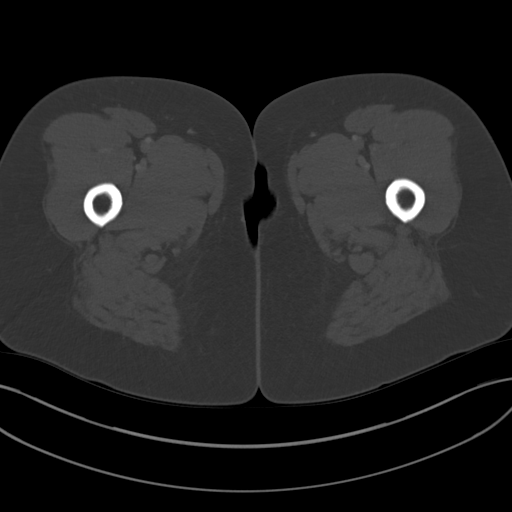
[im 11/87  soft-tissue]
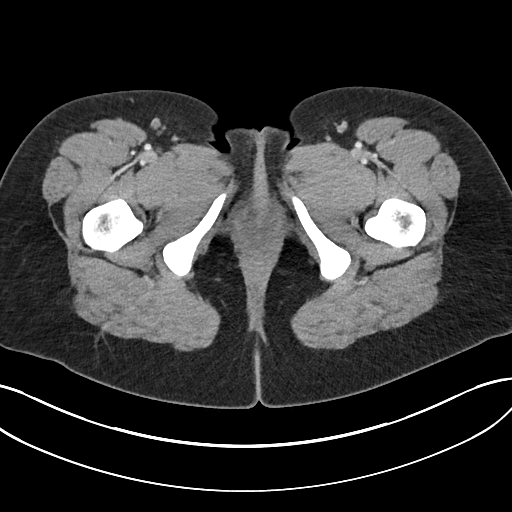
[im 18/87  soft-tissue]
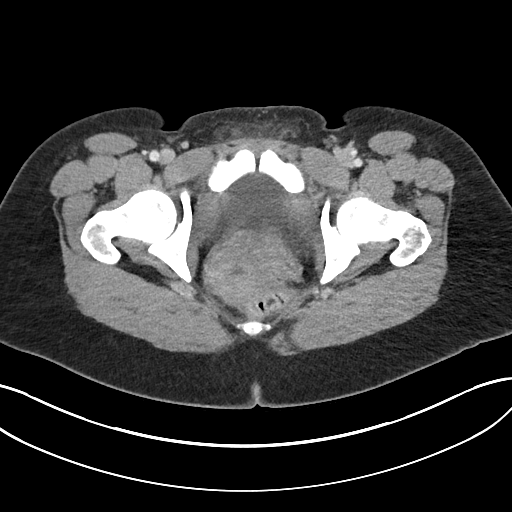
[im 22/87  soft-tissue]
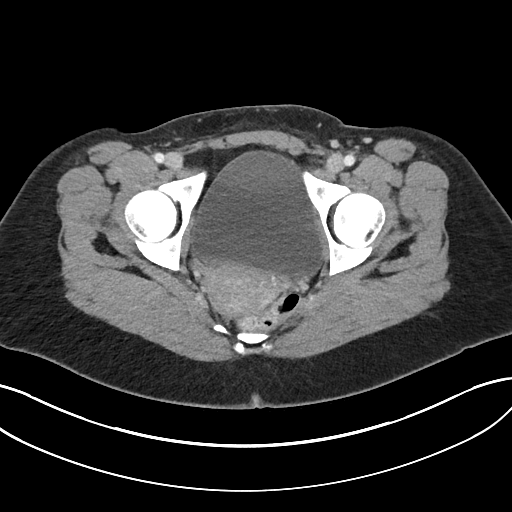
[im 29/87  soft-tissue]
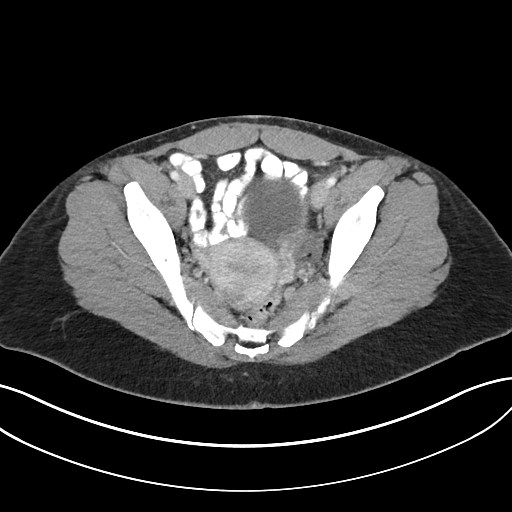
[im 36/87  soft-tissue]
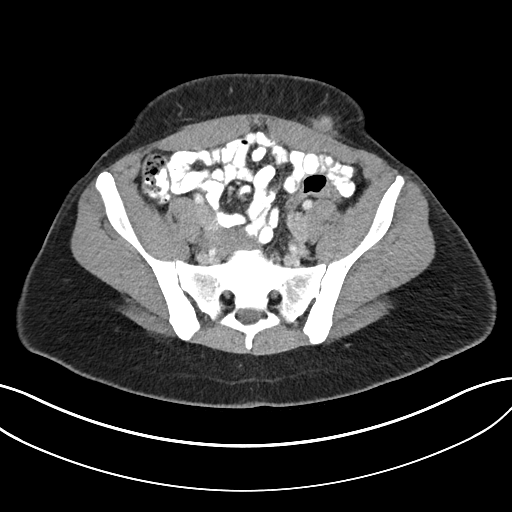
[im 40/87  soft-tissue]
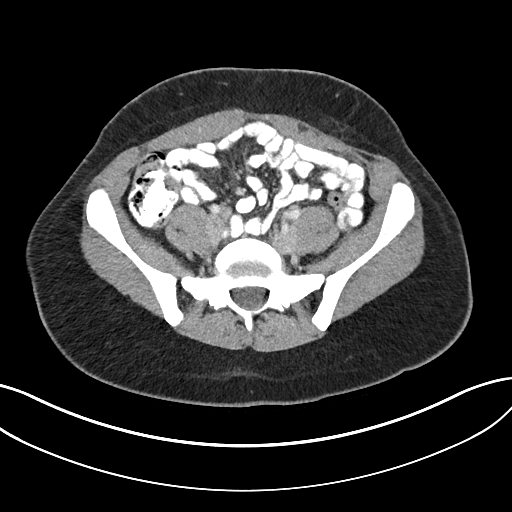
[im 47/87  soft-tissue]
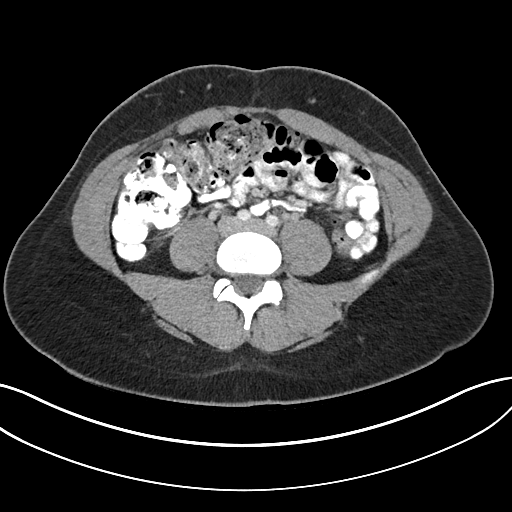
[im 51/87  soft-tissue]
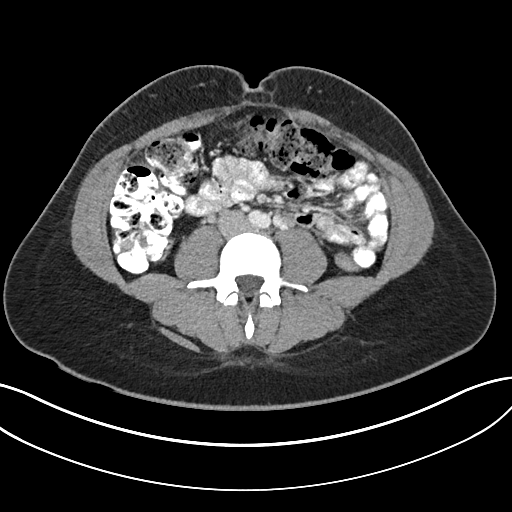
[im 51/87  bone]
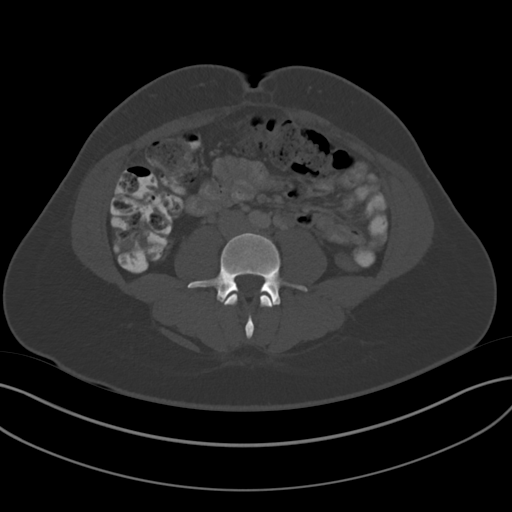
[im 58/87  soft-tissue]
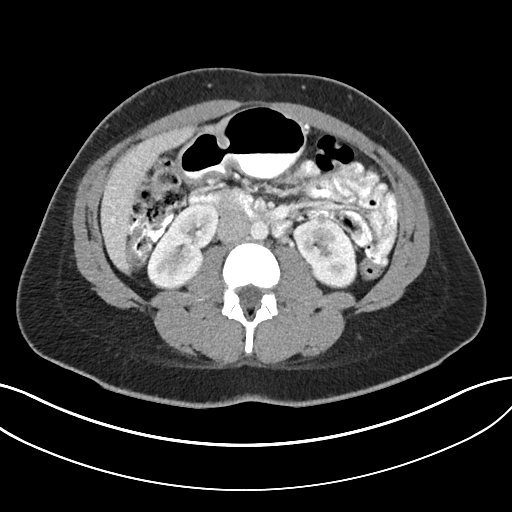
[im 65/87  soft-tissue]
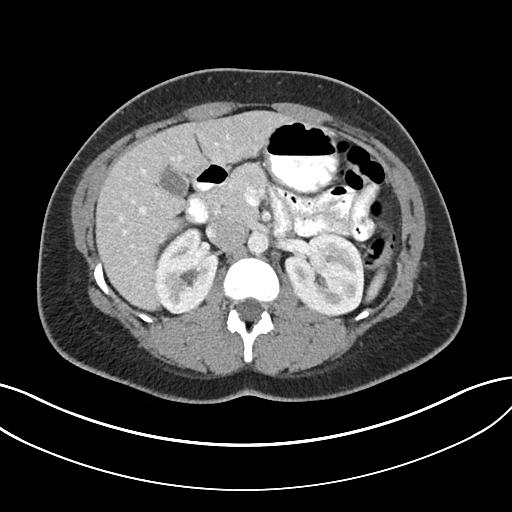
[im 69/87  soft-tissue]
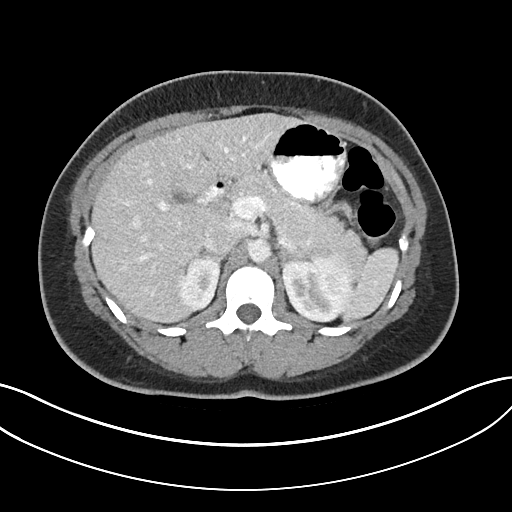
[im 76/87  soft-tissue]
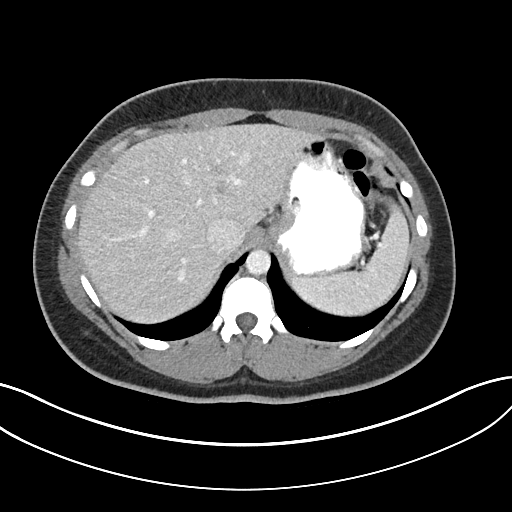
[im 83/87  soft-tissue]
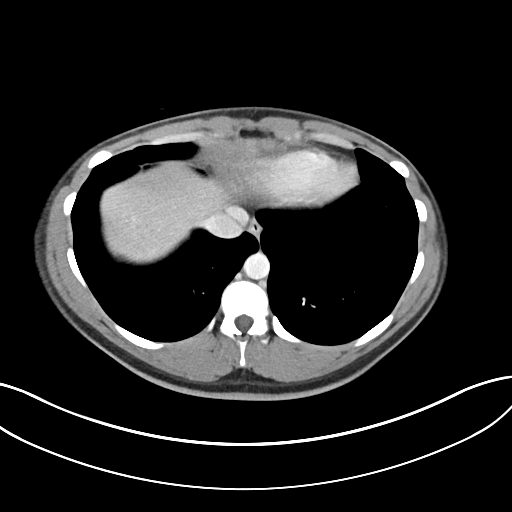

[Series 4: coronals abd pelvis 2.00 cor · coronal · 0.77mm/px · 3 of 135 slices shown]
[im 45/135  soft-tissue]
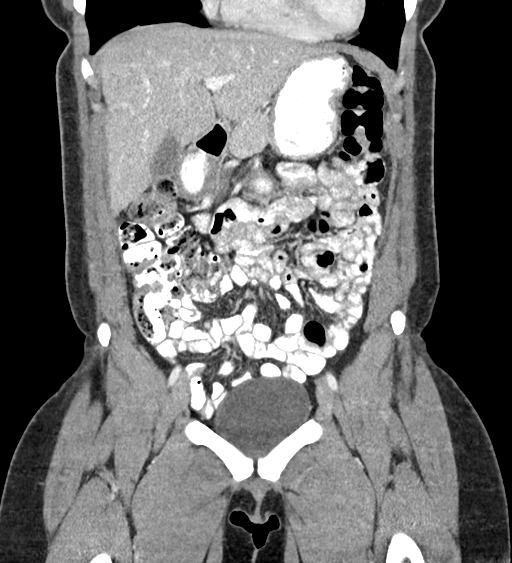
[im 60/135  soft-tissue]
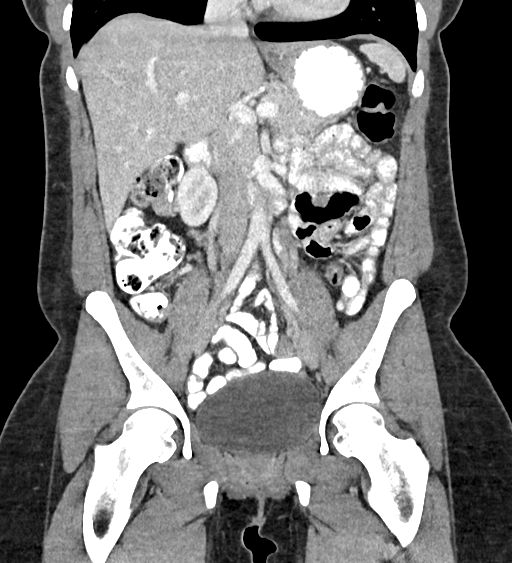
[im 75/135  soft-tissue]
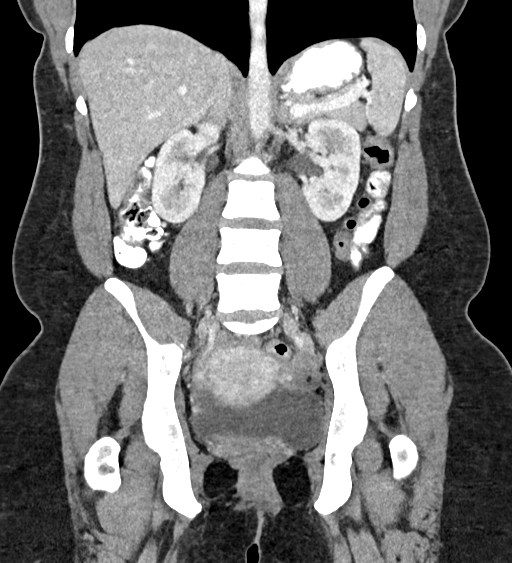

[17 of 46 positions shown; findings below may reference images not displayed]

FINDINGS: Lower chest: Lung bases are clear. No effusions. Heart is normal
size.

Hepatobiliary: No focal hepatic abnormality. Gallbladder
unremarkable.

Pancreas: No focal abnormality or ductal dilatation.

Spleen: No focal abnormality.  Normal size.

Adrenals/Urinary Tract: No adrenal abnormality. No focal renal
abnormality. No stones or hydronephrosis. Urinary bladder is
unremarkable.

Stomach/Bowel: Normal appendix. Stomach, large and small bowel
grossly unremarkable.

Vascular/Lymphatic: No evidence of aneurysm or adenopathy.

Reproductive: Uterus and adnexa unremarkable.  No mass.

Other: No free fluid or free air.

Musculoskeletal: There is a solid enhancing nodule within the
subcutaneous soft tissues in the left abdominal wall anterior to the
left rectus muscle measuring 1.8 x 1.4 cm. This appears to be along
the edge of the C-section scar. No acute bony abnormality.
IMPRESSION: Enhancing subcutaneous nodule along the left side of the C-section
scar. This is most suggestive of scar endometriosis.
